# Patient Record
Sex: Male | Born: 1935 | Race: White | Hispanic: No | Marital: Single | Smoking: Never smoker
Health system: Southern US, Community
[De-identification: ages and names within clinical notes are randomized; demographics above are authoritative.]

## PROBLEM LIST (undated history)

## (undated) DIAGNOSIS — Z9289 Personal history of other medical treatment: Secondary | ICD-10-CM

## (undated) DIAGNOSIS — I639 Cerebral infarction, unspecified: Secondary | ICD-10-CM

## (undated) DIAGNOSIS — I10 Essential (primary) hypertension: Secondary | ICD-10-CM

## (undated) DIAGNOSIS — Z8669 Personal history of other diseases of the nervous system and sense organs: Secondary | ICD-10-CM

## (undated) HISTORY — PX: MASS EXCISION: SHX2000

## (undated) HISTORY — PX: CATARACT EXTRACTION, BILATERAL: SHX1313

---

## 2000-09-14 DIAGNOSIS — I639 Cerebral infarction, unspecified: Secondary | ICD-10-CM

## 2000-09-14 HISTORY — DX: Cerebral infarction, unspecified: I63.9

## 2001-10-15 HISTORY — PX: CAROTID ENDARTERECTOMY: SUR193

## 2001-11-02 ENCOUNTER — Ambulatory Visit (HOSPITAL_COMMUNITY): Admission: RE | Admit: 2001-11-02 | Discharge: 2001-11-02 | Payer: Self-pay | Admitting: Family Medicine

## 2001-11-03 ENCOUNTER — Ambulatory Visit (HOSPITAL_COMMUNITY): Admission: RE | Admit: 2001-11-03 | Discharge: 2001-11-03 | Payer: Self-pay | Admitting: Family Medicine

## 2001-11-03 ENCOUNTER — Encounter: Payer: Self-pay | Admitting: Family Medicine

## 2001-11-09 ENCOUNTER — Encounter: Payer: Self-pay | Admitting: Vascular Surgery

## 2001-11-11 ENCOUNTER — Encounter (INDEPENDENT_AMBULATORY_CARE_PROVIDER_SITE_OTHER): Payer: Self-pay | Admitting: Specialist

## 2001-11-11 ENCOUNTER — Inpatient Hospital Stay (HOSPITAL_COMMUNITY): Admission: RE | Admit: 2001-11-11 | Discharge: 2001-11-12 | Payer: Self-pay | Admitting: Vascular Surgery

## 2001-11-12 ENCOUNTER — Emergency Department (HOSPITAL_COMMUNITY): Admission: EM | Admit: 2001-11-12 | Discharge: 2001-11-12 | Payer: Self-pay | Admitting: Emergency Medicine

## 2007-02-08 ENCOUNTER — Ambulatory Visit: Payer: Self-pay | Admitting: Vascular Surgery

## 2011-01-30 NOTE — Discharge Summary (Signed)
[place]. [facility]  Patient:    Shane Weiss, Shane Weiss Visit Number: [id number] MRN: [id number]          Service Type: EMS Location: [facility] Attending Physician:  Donnetta Hutching Dictated by:   Sherrie George, P.A. Admit Date:  11/12/2001 Discharge Date: 11/12/2001   CC:         Shane Weiss, M.D.   Discharge Summary  DATE OF BIRTH:  01-06-1925  ADMISSION DIAGNOSES: 1. Severe right internal carotid artery stenosis. 2. Hypertension. 3. History of right-sided cerebrovascular accident. 4. History of Bells palsy.  ALLERGIES:  No known drug allergies.  MEDICATIONS:  Lotrelle 20 mg q.d., aspirin 325 mg q.d., Xazosin 4 mg p.o. q.d.  For further history and physical, please see the dictated note.  HOSPITAL COURSE:  The patient was admitted and found to have severe right ICA stenosis preoperatively.  Evaluation by Dr. Hart Rochester lead to his admission for elective right carotid endarterectomy.  The risks and benefits were discussed in detail and informed operative consent was obtained.  He underwent right CE by Dr. Hart Rochester on November 11, 2001.  He tolerated the procedure well and returned to PACU and then 3300 in satisfactory condition.  First postoperative morning, he was neurologically intact.  Wound looked good.  He was mobilized. Labs were all within normal limits with hemoglobin 13, hematocrit 40, white count 7.9, and platelets of 198,000.  Electrolytes were normal; BUN 10, creatinine 1.5.  He was subsequently discharged home in the afternoon after taking p.o. for breakfast and lunch.  DISCHARGE MEDICATIONS:  Preadmission medicine as before and Tylox one to two p.o. q.4h. p.r.n.  FOLLOW-UP:  He was scheduled to return to see Dr. Hart Rochester on Tuesday, March 11, at 9:40 a.m.  His creatinine was slightly elevated at 1.5 postoperatively, was up from his preadmission creatinine of 1.3.  CONDITION ON DISCHARGE:  Improved.  DISCHARGE INSTRUCTIONS:  Walk  daily.  No lifting over 10 pounds.  No driving. No strenuous activity.  Home medicines as before.  Tylox one to two p.o. q.4h. p.r.n. Dictated by:   Sherrie George, P.A. Attending Physician:  Donnetta Hutching DD:  11/21/01 TD:  11/23/01 Job: [id number]

## 2011-01-30 NOTE — Op Note (Signed)
[place]. [facility]  Patient:    Shane, Weiss Visit Number: [id number] MRN: [id number]          Service Type: EMS Location: [facility] Attending Physician:  Donnetta Hutching Dictated by:   Quita Skye. Hart Rochester, M.D. Proc. Date: 11/11/01 Admit Date:  11/12/2001 Discharge Date: 11/12/2001                             Operative Report  PREOPERATIVE DIAGNOSIS:  Severe right internal carotid stenosis with recent right brain cerebrovascular accident.  POSTOPERATIVE DIAGNOSIS:  Severe right internal carotid stenosis with recent right brain cerebrovascular accident.  OPERATION:  Right carotid endarterectomy with Dacron patch angioplasty.  SURGEON:  Quita Skye. Hart Rochester, M.D.  FIRST ASSISTANT:  Larina Earthly, M.D.  SECOND ASSISTANT:  Nurse.  ANESTHESIA:  General endotracheal.  BRIEF HISTORY:  This patient recently suffered a right brain CVA consisting of left upper extremity weakness and clumsiness and some left facial weakness and garbled speech.  This almost completely resolved within the first several days and he now has returned to about 90% of his normal function in the left upper extremity.  He is scheduled now for a right carotid endarterectomy.  He does have a remote history of a Bells palsy causing left facial weakness.  DESCRIPTION OF PROCEDURE:  Patient was taken to the operating room and placed in a supine position, at which time satisfactory general endotracheal anesthesia was administered.  The right neck was prepped with Betadine scrubbing solution and draped in routine sterile manner.  An incision was made along the anterior border of the sternocleidomastoid muscle and carried down through subcutaneous tissue and platysma using the Bovie.  The common facial vein and external jugular vein were ligated with 3-0 silk ties and divided, exposing the common, internal and external carotid arteries.  Care was taken not to injure the vagus or hypoglossal nerves,  both of which were exposed. There was a calcified atherosclerotic plaque at the carotid bifurcation extending up the internal carotid artery about 2 to 3 cm, with the distal vessel appearing normal.  A #10 shunt was prepared and the patient was heparinized.  The carotid vessels were occluded with vascular clamps, longitudinal opening made in the common carotid with a 15 blade and extended up the internal carotid with the Potts scissors to a point distal to the disease.  Plaque was severely stenotic, at least 95%, and very ulcerated.  A #10 shunt was inserted without difficulty, reestablishing flow in about 2 minutes.  A standard endarterectomy was then performed using the elevator and the Potts scissors with an eversion endarterectomy at the external carotid.  The plaque feathered off the distal internal carotid artery nicely, not requiring any tacking sutures.  The lumen was thoroughly irrigated with heparin and saline and all loose debris carefully removed and arteriotomy was closed with a patch using continuous 6-0 Prolene.  Prior to completion of closure, the shunt was removed after about 30 minutes of shunt time following antegrade and retrograde flushing.  The closure was completed with reestablishment of flow initially up the external, then up the internal branch.  The carotid was occluded for less than two minutes for removal of the shunt.  Protamine was then given to reverse the heparin.  Following adequate hemostasis, wound was irrigated with saline and closed in layers with Vicryl in a subcuticular fashion, sterile dressing applied and patient taken to recovery room in satisfactory condition. Dictated  by:   Quita Skye Hart Rochester, M.D. Attending Physician:  Donnetta Hutching DD:  11/11/01 TD:  11/11/01 Job: [id number]

## 2011-01-30 NOTE — H&P (Signed)
. [facility]  Patient:    Shane Weiss, Shane Weiss Visit Number: [id number] MRN: [id number]          Service Type: Attending:  Quita Skye. Hart Rochester, M.D. Dictated by:   Adair Patter, P.A. Adm. Date:  11/11/01   CC:         CVTS office   History and Physical  CHIEF COMPLAINT:  Carotid artery disease.  HISTORY OF PRESENT ILLNESS:  This is a 77 year old male who was referred from Dr. Holley Bouche.  The patient states approximately 2-3 weeks ago he had sudden onset of left arm and hand weakness, clumsiness, and facial weakness and slurred speech.  He said with the exception of some left arm clumsiness these symptoms resolved in the next few hours.  Today he comes to the office for a preoperative history and physical and states that all of his symptoms have resolved except for some mild weakness in his left arm.  The patient says he feels that his muscle strength is about 90% of what it was before this episode.  During this time the patient denies any headaches, nausea, vomiting, vertigo, dizziness, history of falls, seizures, numbness, or tingling.  He denied any dysphagia or visual changes.  He denied any syncope or presyncope, memory loss, or confusion with this episode.  PAST MEDICAL HISTORY: 1. Carotid artery disease. 2. Hypertension. 3. Right-sided cerebrovascular accident. 4. Bells palsy.  PAST SURGICAL HISTORY:  The patient denies any surgeries except for minor scalp surgery.  ALLERGIES:  The patient denies any medication allergies.  MEDICATIONS: 1. Lotrel 20 mg p.o. q.d. 2. Aspirin 325 mg p.o. q.d. 3. Doxazosin 4 mg p.o. q.d.  FAMILY HISTORY:  The patients mother died of stomach cancer.  His father had a history positive for cerebrovascular accident and myocardial infarction.  He has a sister who is still living and has diabetes mellitus and hypertension.  SOCIAL HISTORY:  The patient lives alone, denies any alcohol use.  He smoked about a  half pack of cigarettes a day for less than five years but quit in 1960.  REVIEW OF SYSTEMS:  GENERAL:  The patient denies any fever, chills, night sweats, or frequent illnesses.  HEAD:  The patient denies any head injuries, loss of consciousness, or seizures.  EYES:  The patient denies any visual disturbances, but does wear corrective lenses.  EARS:  The patient denies any tinnitus, vertigo, hearing loss, or ear infections.  NOSE:  The patient denies any epistaxis, rhinitis, or sinusitis.  MOUTH:  The patient denies any problems with dentition or frequent sore throats.  NECK:  The patient denies any lumps, masses, or pain on range of motion of his neck.  CARDIOVASCULAR: The patient has a history of hypertension which is treated medically.  He denies any history of myocardial infarction.  PULMONARY:  The patient denies any asthma, bronchitis, emphysema, or pneumonia.  ENDOCRINE:  The patient denies any thyroid disease or diabetes mellitus.  GASTROINTESTINAL:  The patient denies any nausea, vomiting, diarrhea, constipation, hematochezia, or melena.  GENITOURINARY:  The patient denies any impotence, urinary tract infections, or incontinence.  MUSCULOSKELETAL:  The patient denies any arthritis, arthralgias, or myalgias.  NEUROLOGIC:  The patient has a history of right-sided cerebrovascular accident as stated in the history of present illness.  He denies any memory loss or depression.  PHYSICAL EXAMINATION:  VITAL SIGNS:  Blood pressure is 138/80, pulse is 84 and regular, respirations are 16.  GENERAL:  The patient is alert and oriented  x3 and is in no distress.  HEENT:  Head is atraumatic, normocephalic.  Eyes:  Pupils are equal, round, reactive to light and accommodation.  Extraocular movements are intact, without scleral icterus or nystagmus.  Ears:  Auditory acuity is grossly intact.  Nose:  Nares are patent and intact.  Sinuses are nontender.  Mouth: Moist without exudate.  NECK:   Supple without JVD, lymphadenopathy, or thyromegaly.  He had a right-sided carotid bruit.  CARDIOVASCULAR:  Revealed regular rate and rhythm without murmurs, gallops, or rubs.  LUNGS:  Bilaterally clear to auscultation.  No rales, rhonchi, or wheezes.  ABDOMEN:  Soft, nontender, nondistended.  Positive bowel sounds in all four quadrants.  EXTREMITIES:  Revealed no cyanosis, clubbing, or edema.  Peripheral pulse exam revealed 2+ carotid, femoral, popliteal pulses and 1+ dorsalis pedis pulses bilaterally.  NEUROLOGIC:  Cranial nerves II-XII are grossly intact.  The patient had a steady gait without the use of any assistive devices.  He had 5+ and equal strength in all four extremities.  No muscular deficit was noted on the left side of his body.  IMPRESSION:  Right internal carotid artery stenosis.  PLAN:  The patient will undergo a right carotid endarterectomy on February 28 by Dr. Josephina Gip. Dictated by:   Adair Patter, P.A. Attending:  Quita Skye. Hart Rochester, M.D. DD:  11/09/01 TD:  11/09/01 Job: [id number]

## 2017-09-08 ENCOUNTER — Emergency Department (HOSPITAL_COMMUNITY): Payer: Medicare Other

## 2017-09-08 ENCOUNTER — Encounter (HOSPITAL_COMMUNITY): Payer: Self-pay

## 2017-09-08 ENCOUNTER — Emergency Department (HOSPITAL_COMMUNITY)
Admission: EM | Admit: 2017-09-08 | Discharge: 2017-09-09 | Disposition: A | Discharge status: Home / Self Care | Payer: Medicare Other | Attending: Emergency Medicine | Admitting: Emergency Medicine

## 2017-09-08 DIAGNOSIS — R197 Diarrhea, unspecified: Secondary | ICD-10-CM | POA: Diagnosis present

## 2017-09-08 DIAGNOSIS — Z7982 Long term (current) use of aspirin: Secondary | ICD-10-CM | POA: Diagnosis not present

## 2017-09-08 DIAGNOSIS — Z8673 Personal history of transient ischemic attack (TIA), and cerebral infarction without residual deficits: Secondary | ICD-10-CM | POA: Diagnosis not present

## 2017-09-08 DIAGNOSIS — K529 Noninfective gastroenteritis and colitis, unspecified: Secondary | ICD-10-CM

## 2017-09-08 DIAGNOSIS — Z79899 Other long term (current) drug therapy: Secondary | ICD-10-CM | POA: Diagnosis not present

## 2017-09-08 DIAGNOSIS — I10 Essential (primary) hypertension: Secondary | ICD-10-CM | POA: Insufficient documentation

## 2017-09-08 HISTORY — DX: Essential (primary) hypertension: I10

## 2017-09-08 HISTORY — DX: Cerebral infarction, unspecified: I63.9

## 2017-09-08 LAB — CBG MONITORING, ED: Glucose-Capillary: 132 mg/dL — ABNORMAL HIGH (ref 65–99)

## 2017-09-08 LAB — COMPREHENSIVE METABOLIC PANEL
ALK PHOS: 82 U/L (ref 38–126)
ALT: 11 U/L — AB (ref 17–63)
AST: 25 U/L (ref 15–41)
Albumin: 3.1 g/dL — ABNORMAL LOW (ref 3.5–5.0)
Anion gap: 9 (ref 5–15)
BUN: 25 mg/dL — ABNORMAL HIGH (ref 6–20)
CALCIUM: 8.3 mg/dL — AB (ref 8.9–10.3)
CO2: 20 mmol/L — ABNORMAL LOW (ref 22–32)
CREATININE: 1.87 mg/dL — AB (ref 0.61–1.24)
Chloride: 110 mmol/L (ref 101–111)
GFR, EST AFRICAN AMERICAN: 35 mL/min — AB (ref >60)
GFR, EST NON AFRICAN AMERICAN: 30 mL/min — AB (ref >60)
Glucose, Bld: 140 mg/dL — ABNORMAL HIGH (ref 65–99)
Potassium: 5.1 mmol/L (ref 3.5–5.1)
Sodium: 139 mmol/L (ref 135–145)
Total Bilirubin: 1.6 mg/dL — ABNORMAL HIGH (ref 0.3–1.2)
Total Protein: 6.2 g/dL — ABNORMAL LOW (ref 6.5–8.1)

## 2017-09-08 LAB — CBC
HCT: 38.8 % — ABNORMAL LOW (ref 39.0–52.0)
Hemoglobin: 12.5 g/dL — ABNORMAL LOW (ref 13.0–17.0)
MCH: 30.9 pg (ref 26.0–34.0)
MCHC: 32.2 g/dL (ref 30.0–36.0)
MCV: 95.8 fL (ref 78.0–100.0)
PLATELETS: 145 10*3/uL — AB (ref 150–400)
RBC: 4.05 MIL/uL — AB (ref 4.22–5.81)
RDW: 14.3 % (ref 11.5–15.5)
WBC: 14.4 10*3/uL — AB (ref 4.0–10.5)

## 2017-09-08 LAB — LIPASE, BLOOD: Lipase: 28 U/L (ref 11–51)

## 2017-09-08 LAB — URINALYSIS, ROUTINE W REFLEX MICROSCOPIC
BILIRUBIN URINE: NEGATIVE
Glucose, UA: NEGATIVE mg/dL
Ketones, ur: NEGATIVE mg/dL
LEUKOCYTES UA: NEGATIVE
Nitrite: NEGATIVE
PH: 5 (ref 5.0–8.0)
Protein, ur: NEGATIVE mg/dL
SPECIFIC GRAVITY, URINE: 1.01 (ref 1.005–1.030)

## 2017-09-08 LAB — C DIFFICILE QUICK SCREEN W PCR REFLEX
C DIFFICILE (CDIFF) INTERP: NOT DETECTED
C DIFFICILE (CDIFF) TOXIN: NEGATIVE
C DIFFICLE (CDIFF) ANTIGEN: NEGATIVE

## 2017-09-08 LAB — I-STAT CG4 LACTIC ACID, ED: Lactic Acid, Venous: 1.1 mmol/L (ref 0.5–1.9)

## 2017-09-08 LAB — POC OCCULT BLOOD, ED: FECAL OCCULT BLD: POSITIVE — AB

## 2017-09-08 LAB — I-STAT TROPONIN, ED: TROPONIN I, POC: 0.01 ng/mL (ref 0.00–0.08)

## 2017-09-08 MED ORDER — IOPAMIDOL (ISOVUE-300) INJECTION 61%
INTRAVENOUS | Status: AC
  Filled 2017-09-08: qty 100

## 2017-09-08 NOTE — ED Triage Notes (Signed)
Pt from home with ems. Family found pt slumped over in bathroom today and called EMS. Pt having difficult ambulating prior to episode which is not his baseline. Pt c.o severe abd pain that started today along with vomiting. Pt vomiting multiple times today. Pt had 2 BMs en route. Pt alert and oriented upon arrival, VSS, nad 18G LFA.

## 2017-09-08 NOTE — ED Provider Notes (Signed)
[facility] EMERGENCY [facility] Provider Note   CSN: [id number] Arrival date & time: 09/08/17  2019     History   Chief Complaint Chief Complaint  Patient presents with  . Loss of Consciousness  . Abdominal Pain  . Emesis    HPI Shane Weiss is a 83 y.o. male.  HPI   Patient is a 83 year old male with past medical history significant for "mini strokes".  And hypertension.  Patient will is here today with acute onset of vomiting and diarrhea.  Patient was washing well for correction at 7 PM when he had acute onset of abdominal pain.  He felt like he had a stool, could not.  Then vomited once and then stooled multiple times.  Patient had a syncopal episode while on the toilet.  Patient lost control of his bowels several times.  Reports the stool is not particularly dark or black.  He ate the same thing his wife, chicken fried steak, biscuits, green beans, cantelope  Since coming to the emergency department, patient has had 3 large stooling events.  No recent antibiotic use.  Despite having stooled multiple times before me seeing him.  On arrival patient is asking immediately to go home.  Patient does have normal vital signs.  Past Medical History:  Diagnosis Date  . Hypertension   . Stroke [facility](HCC)    2002    There are no active problems to display for this patient.       Home Medications    Prior to Admission medications   Medication Sig Start Date End Date Taking? Authorizing Provider  amLODipine (NORVASC) 10 MG tablet Take 10 mg by mouth daily. 08/18/17  Yes [provider]  aspirin 81 MG tablet Take 81 mg by mouth daily.   Yes [provider]  benazepril (LOTENSIN) 40 MG tablet Take 40 mg by mouth daily. 07/08/13  Yes [provider]  dorzolamide-timolol (COSOPT) 22.3-6.8 MG/ML ophthalmic solution Place 1 drop into both eyes at bedtime. 08/30/17  Yes [provider]  doxazosin (CARDURA) 8 MG tablet Take 8 mg  by mouth at bedtime.  08/19/17  Yes [provider]    Family History No family history on file.  Social History Social History   Tobacco Use  . Smoking status: Not on file  Substance Use Topics  . Alcohol use: Not on file  . Drug use: Not on file     Allergies   Patient has no allergy information on record.   Review of Systems Review of Systems  Constitutional: Positive for fatigue. Negative for activity change and fever.  Respiratory: Negative for shortness of breath.   Cardiovascular: Negative for chest pain.  Gastrointestinal: Positive for abdominal pain, nausea and vomiting.  Neurological: Positive for syncope.  All other systems reviewed and are negative.    Physical Exam Updated Vital Signs BP (!) 141/53   Pulse 75   Temp 98.1 F (36.7 C) (Oral)   Resp (!) 30   Ht 5\' 11"  (1.803 m)   Wt 59 kg (130 lb)   SpO2 93%   BMI 18.13 kg/m   Physical Exam  Constitutional: He appears well-developed and well-nourished.  HENT:  Head: Normocephalic and atraumatic.  Eyes: Conjunctivae are normal.  Neck: Neck supple.  Cardiovascular: Normal rate and regular rhythm.  No murmur heard. Pulmonary/Chest: Effort normal and breath sounds normal. No respiratory distress.  Abdominal: Soft. Normal appearance and bowel sounds are normal. There is no tenderness.  Musculoskeletal: He exhibits  no edema.  Neurological: He is alert.  Skin: Skin is warm and dry.  Psychiatric: He has a normal mood and affect.  Nursing note and vitals reviewed.    ED Treatments / Results  Labs (all labs ordered are listed, but only abnormal results are displayed) Labs Reviewed  CBC - Abnormal; Notable for the following components:      Result Value   WBC 14.4 (*)    RBC 4.05 (*)    Hemoglobin 12.5 (*)    HCT 38.8 (*)    Platelets 145 (*)    All other components within normal limits  CBG MONITORING, ED - Abnormal; Notable for the following components:   Glucose-Capillary 132 (*)      All other components within normal limits  POC OCCULT BLOOD, ED - Abnormal; Notable for the following components:   Fecal Occult Bld POSITIVE (*)    All other components within normal limits  GASTROINTESTINAL PANEL BY PCR, STOOL (REPLACES STOOL CULTURE)  C DIFFICILE QUICK SCREEN W PCR REFLEX  LIPASE, BLOOD  COMPREHENSIVE METABOLIC PANEL  URINALYSIS, ROUTINE W REFLEX MICROSCOPIC  OCCULT BLOOD X 1 CARD TO LAB, STOOL    EKG  EKG Interpretation None       Radiology No results found.  Procedures Procedures (including critical care time)  Medications Ordered in ED Medications - No data to display   Initial Impression / Assessment and Plan / ED Course  I have reviewed the triage vital signs and the nursing notes.  Pertinent labs & imaging results that were available during my care of the patient were reviewed by me and considered in my medical decision making (see chart for details).     Patient is a 83 year old male with past medical history significant for "mini strokes".  And hypertension.  Patient will is here today with acute onset of vomiting and diarrhea.  Patient was washing well for correction at 7 PM when he had acute onset of abdominal pain.  He felt like he had a stool, could not.  Then vomited once and then stooled multiple times.  Patient had a syncopal episode while on the toilet.  Patient lost control of his bowels several times.  Reports the stool is not particularly dark or black.  He ate the same thing his wife, chicken fried steak, biscuits, green beans, cantelope  Since coming to the emergency department, patient has had 3 large stooling events.  No recent antibiotic use.  Despite having stooled multiple times before me seeing him.  On arrival patient is asking immediately to go home.  Patient does have normal vital signs.  10:17 PM Given his acute symtpoms, concern for obstruction.   Could also cause consider GI bleed, however the still not appear  black or tarry.  It was a fecal occult positive.  Patient not have the pain now, so less likely to be mesenteric ischemia.  Also possible that its just a reaction to a preformed toxin.  Signed out to colleague to follow up CT.   Final Clinical Impressions(s) / ED Diagnoses   Final diagnoses:  None    ED Discharge Orders    None       Abelino DerrickMackuen, Aldo Sondgeroth Lyn, MD 09/09/17 [id number]

## 2017-09-08 NOTE — ED Notes (Signed)
Patient transported to CT 

## 2017-09-08 NOTE — ED Notes (Signed)
Attempted to obtain UA, patient aware of need for urine specimen.

## 2017-09-09 LAB — GASTROINTESTINAL PANEL BY PCR, STOOL (REPLACES STOOL CULTURE)

## 2017-09-09 MED ORDER — METRONIDAZOLE 500 MG PO TABS: 500.0000 mg | ORAL_TABLET | Freq: Two times a day (BID) | ORAL | 0 refills | Status: DC

## 2017-09-09 MED ORDER — CIPROFLOXACIN HCL 500 MG PO TABS: 500.0000 mg | ORAL_TABLET | Freq: Two times a day (BID) | ORAL | 0 refills | Status: DC

## 2017-09-09 MED ORDER — METRONIDAZOLE 500 MG PO TABS
500.0000 mg | ORAL_TABLET | Freq: Once | ORAL | Status: AC
  Administered 2017-09-09: 500 mg via ORAL
  Filled 2017-09-09: qty 1

## 2017-09-09 MED ORDER — CIPROFLOXACIN HCL 500 MG PO TABS
500.0000 mg | ORAL_TABLET | Freq: Once | ORAL | Status: AC
  Administered 2017-09-09: 500 mg via ORAL
  Filled 2017-09-09: qty 1

## 2017-09-09 NOTE — ED Notes (Signed)
Pt given water and crackers, tolerated well.

## 2017-09-09 NOTE — ED Provider Notes (Signed)
I received this patient in signout from Dr. Corlis LeakMacKuen; we were awaiting CT results. CT shows wall thickening of sigmoid colon suggestive of infectious process with possible underlying malignancy. I discussed these findings with the patient and family, including need for GI referral for colonoscopy. Elected to initiate  Therapy with cipro/flagyl. Ultimately recommended overnight admission given severity of symptoms, findings on CT, and syncope while on toilet. Pt wanted to go home and voiced understanding of risks. They have also voiced understanding of return precautions and treatment/follow up plan.   Shane Weiss, [name], MD 09/09/17 [phone]

## 2017-09-09 NOTE — Discharge Instructions (Signed)
DRINK PLENTY OF FLUIDS. TAKE ALL ANTIBIOTICS UNTIL FINISHED. FOLLOW UP WITH [facility] FOR A COLONOSCOPY. RETURN TO ER IF ANY WORSENING SYMPTOMS, BLOODY STOOLS, BLACK STOOLS, FEVER, SEVERE ABDOMINAL PAIN, OR CONCERNS FOR DEHYDRATION.

## 2018-03-30 ENCOUNTER — Emergency Department (HOSPITAL_COMMUNITY): Payer: Medicare Other

## 2018-03-30 ENCOUNTER — Other Ambulatory Visit: Payer: Self-pay

## 2018-03-30 ENCOUNTER — Encounter (HOSPITAL_COMMUNITY): Payer: Self-pay | Admitting: Internal Medicine

## 2018-03-30 ENCOUNTER — Inpatient Hospital Stay (HOSPITAL_COMMUNITY)
Admission: EM | Admit: 2018-03-30 | Discharge: 2018-04-02 | DRG: 372 | Disposition: A | Discharge status: Home Health | Payer: Medicare Other | Attending: Internal Medicine | Admitting: Internal Medicine

## 2018-03-30 DIAGNOSIS — Z8249 Family history of ischemic heart disease and other diseases of the circulatory system: Secondary | ICD-10-CM

## 2018-03-30 DIAGNOSIS — Z8673 Personal history of transient ischemic attack (TIA), and cerebral infarction without residual deficits: Secondary | ICD-10-CM

## 2018-03-30 DIAGNOSIS — Z7982 Long term (current) use of aspirin: Secondary | ICD-10-CM

## 2018-03-30 DIAGNOSIS — Z9842 Cataract extraction status, left eye: Secondary | ICD-10-CM

## 2018-03-30 DIAGNOSIS — K449 Diaphragmatic hernia without obstruction or gangrene: Secondary | ICD-10-CM | POA: Diagnosis present

## 2018-03-30 DIAGNOSIS — E860 Dehydration: Secondary | ICD-10-CM | POA: Diagnosis present

## 2018-03-30 DIAGNOSIS — D509 Iron deficiency anemia, unspecified: Secondary | ICD-10-CM | POA: Diagnosis present

## 2018-03-30 DIAGNOSIS — I10 Essential (primary) hypertension: Secondary | ICD-10-CM | POA: Diagnosis not present

## 2018-03-30 DIAGNOSIS — N184 Chronic kidney disease, stage 4 (severe): Secondary | ICD-10-CM | POA: Diagnosis present

## 2018-03-30 DIAGNOSIS — I129 Hypertensive chronic kidney disease with stage 1 through stage 4 chronic kidney disease, or unspecified chronic kidney disease: Secondary | ICD-10-CM | POA: Diagnosis present

## 2018-03-30 DIAGNOSIS — R569 Unspecified convulsions: Secondary | ICD-10-CM | POA: Diagnosis present

## 2018-03-30 DIAGNOSIS — I959 Hypotension, unspecified: Secondary | ICD-10-CM | POA: Diagnosis present

## 2018-03-30 DIAGNOSIS — D649 Anemia, unspecified: Secondary | ICD-10-CM

## 2018-03-30 DIAGNOSIS — Z9841 Cataract extraction status, right eye: Secondary | ICD-10-CM | POA: Diagnosis not present

## 2018-03-30 DIAGNOSIS — R55 Syncope and collapse: Secondary | ICD-10-CM

## 2018-03-30 DIAGNOSIS — D500 Iron deficiency anemia secondary to blood loss (chronic): Secondary | ICD-10-CM | POA: Diagnosis not present

## 2018-03-30 DIAGNOSIS — K317 Polyp of stomach and duodenum: Secondary | ICD-10-CM | POA: Diagnosis present

## 2018-03-30 DIAGNOSIS — Z9289 Personal history of other medical treatment: Secondary | ICD-10-CM

## 2018-03-30 DIAGNOSIS — A0472 Enterocolitis due to Clostridium difficile, not specified as recurrent: Principal | ICD-10-CM | POA: Diagnosis present

## 2018-03-30 DIAGNOSIS — R262 Difficulty in walking, not elsewhere classified: Secondary | ICD-10-CM

## 2018-03-30 DIAGNOSIS — K210 Gastro-esophageal reflux disease with esophagitis: Secondary | ICD-10-CM | POA: Diagnosis present

## 2018-03-30 DIAGNOSIS — Z79899 Other long term (current) drug therapy: Secondary | ICD-10-CM | POA: Diagnosis not present

## 2018-03-30 DIAGNOSIS — N179 Acute kidney failure, unspecified: Secondary | ICD-10-CM | POA: Diagnosis present

## 2018-03-30 DIAGNOSIS — D62 Acute posthemorrhagic anemia: Secondary | ICD-10-CM | POA: Diagnosis present

## 2018-03-30 DIAGNOSIS — R296 Repeated falls: Secondary | ICD-10-CM | POA: Diagnosis present

## 2018-03-30 DIAGNOSIS — K2940 Chronic atrophic gastritis without bleeding: Secondary | ICD-10-CM | POA: Diagnosis present

## 2018-03-30 DIAGNOSIS — R269 Unspecified abnormalities of gait and mobility: Secondary | ICD-10-CM | POA: Diagnosis present

## 2018-03-30 HISTORY — DX: Personal history of other medical treatment: Z92.89

## 2018-03-30 HISTORY — DX: Personal history of other diseases of the nervous system and sense organs: Z86.69

## 2018-03-30 LAB — HEMOGLOBIN AND HEMATOCRIT, BLOOD
HCT: 23.5 % — ABNORMAL LOW (ref 39.0–52.0)
Hemoglobin: 6 g/dL — CL (ref 13.0–17.0)

## 2018-03-30 LAB — ABO/RH: ABO/RH(D): A POS

## 2018-03-30 LAB — BASIC METABOLIC PANEL
Anion gap: 11 (ref 5–15)
BUN: 22 mg/dL (ref 8–23)
CALCIUM: 8 mg/dL — AB (ref 8.9–10.3)
CHLORIDE: 113 mmol/L — AB (ref 98–111)
CO2: 18 mmol/L — AB (ref 22–32)
CREATININE: 1.84 mg/dL — AB (ref 0.61–1.24)
GFR calc Af Amer: 36 mL/min — ABNORMAL LOW (ref >60)
GFR calc non Af Amer: 31 mL/min — ABNORMAL LOW (ref >60)
Glucose, Bld: 154 mg/dL — ABNORMAL HIGH (ref 70–99)
Potassium: 4.7 mmol/L (ref 3.5–5.1)
Sodium: 142 mmol/L (ref 135–145)

## 2018-03-30 LAB — CBC WITH DIFFERENTIAL/PLATELET
Abs Immature Granulocytes: 0.1 10*3/uL (ref 0.0–0.1)
Basophils Absolute: 0 10*3/uL (ref 0.0–0.1)
Basophils Relative: 0 %
EOS ABS: 0 10*3/uL (ref 0.0–0.7)
Eosinophils Relative: 0 %
HEMATOCRIT: 21.9 % — AB (ref 39.0–52.0)
Hemoglobin: 5.6 g/dL — CL (ref 13.0–17.0)
Immature Granulocytes: 1 %
LYMPHS ABS: 0.6 10*3/uL — AB (ref 0.7–4.0)
Lymphocytes Relative: 4 %
MCH: 20.5 pg — AB (ref 26.0–34.0)
MCHC: 25.6 g/dL — AB (ref 30.0–36.0)
MCV: 80.2 fL (ref 78.0–100.0)
MONO ABS: 0.6 10*3/uL (ref 0.1–1.0)
MONOS PCT: 4 %
Neutro Abs: 13.2 10*3/uL — ABNORMAL HIGH (ref 1.7–7.7)
Neutrophils Relative %: 91 %
Platelets: 281 10*3/uL (ref 150–400)
RBC: 2.73 MIL/uL — ABNORMAL LOW (ref 4.22–5.81)
RDW: 19.3 % — AB (ref 11.5–15.5)
WBC: 14.5 10*3/uL — ABNORMAL HIGH (ref 4.0–10.5)

## 2018-03-30 LAB — CBG MONITORING, ED: GLUCOSE-CAPILLARY: 128 mg/dL — AB (ref 70–99)

## 2018-03-30 LAB — IRON AND TIBC
IRON: 16 ug/dL — AB (ref 45–182)
SATURATION RATIOS: 7 % — AB (ref 17.9–39.5)
TIBC: 231 ug/dL — ABNORMAL LOW (ref 250–450)
UIBC: 215 ug/dL

## 2018-03-30 LAB — VITAMIN B12: Vitamin B-12: 222 pg/mL (ref 180–914)

## 2018-03-30 LAB — PREPARE RBC (CROSSMATCH)

## 2018-03-30 LAB — LACTATE DEHYDROGENASE: LDH: 184 U/L (ref 98–192)

## 2018-03-30 LAB — SAVE SMEAR

## 2018-03-30 LAB — RETICULOCYTES
RBC.: 2.91 MIL/uL — AB (ref 4.22–5.81)
RETIC COUNT ABSOLUTE: 43.7 10*3/uL (ref 19.0–186.0)
Retic Ct Pct: 1.5 % (ref 0.4–3.1)

## 2018-03-30 LAB — FERRITIN: FERRITIN: 8 ng/mL — AB (ref 24–336)

## 2018-03-30 LAB — I-STAT TROPONIN, ED: Troponin i, poc: 0 ng/mL (ref 0.00–0.08)

## 2018-03-30 LAB — FOLATE: FOLATE: 14.8 ng/mL (ref >5.9)

## 2018-03-30 LAB — POC OCCULT BLOOD, ED: Fecal Occult Bld: POSITIVE — AB

## 2018-03-30 MED ORDER — LATANOPROST 0.005 % OP SOLN
1.0000 [drp] | Freq: Every day | OPHTHALMIC | Status: DC
  Administered 2018-03-30 – 2018-04-01 (×3): 1 [drp] via OPHTHALMIC
  Filled 2018-03-30: qty 2.5

## 2018-03-30 MED ORDER — ACETAMINOPHEN 650 MG RE SUPP: 650.0000 mg | Freq: Four times a day (QID) | RECTAL | Status: DC | PRN

## 2018-03-30 MED ORDER — ONDANSETRON HCL 4 MG PO TABS
4.0000 mg | ORAL_TABLET | Freq: Four times a day (QID) | ORAL | Status: DC | PRN
  Administered 2018-03-30: 4 mg via ORAL
  Filled 2018-03-30: qty 1

## 2018-03-30 MED ORDER — SODIUM CHLORIDE 0.9 % IV SOLN
10.0000 mL/h | Freq: Once | INTRAVENOUS | Status: AC
  Administered 2018-03-30: 10 mL/h via INTRAVENOUS

## 2018-03-30 MED ORDER — SODIUM CHLORIDE 0.9 % IV BOLUS
1000.0000 mL | Freq: Once | INTRAVENOUS | Status: AC
  Administered 2018-03-30: 1000 mL via INTRAVENOUS

## 2018-03-30 MED ORDER — ONDANSETRON HCL 4 MG/2ML IJ SOLN: 4.0000 mg | Freq: Four times a day (QID) | INTRAMUSCULAR | Status: DC | PRN

## 2018-03-30 MED ORDER — ENSURE ENLIVE PO LIQD
237.0000 mL | Freq: Two times a day (BID) | ORAL | Status: DC
  Administered 2018-03-31 – 2018-04-02 (×4): 237 mL via ORAL

## 2018-03-30 MED ORDER — SODIUM CHLORIDE 0.9 % IV SOLN
INTRAVENOUS | Status: DC
  Administered 2018-03-30: 20:00:00 via INTRAVENOUS
  Administered 2018-03-31: 1000 mL via INTRAVENOUS
  Administered 2018-04-01 (×2): via INTRAVENOUS

## 2018-03-30 MED ORDER — GI COCKTAIL ~~LOC~~
30.0000 mL | Freq: Once | ORAL | Status: AC
  Administered 2018-03-30: 30 mL via ORAL
  Filled 2018-03-30: qty 30

## 2018-03-30 MED ORDER — DORZOLAMIDE HCL-TIMOLOL MAL 2-0.5 % OP SOLN
1.0000 [drp] | Freq: Every day | OPHTHALMIC | Status: DC
  Administered 2018-03-30 – 2018-04-01 (×3): 1 [drp] via OPHTHALMIC
  Filled 2018-03-30: qty 10

## 2018-03-30 MED ORDER — ACETAMINOPHEN 325 MG PO TABS: 650.0000 mg | ORAL_TABLET | Freq: Four times a day (QID) | ORAL | Status: DC | PRN

## 2018-03-30 NOTE — ED Notes (Signed)
Patient tolerating blood well. No signs of reaction to blood. Pt has no complaints. Will continue to monitor.

## 2018-03-30 NOTE — H&P (Addendum)
History and Physical    Shane Weiss ZOX:[id number] DOB: 05/05/1925 DOA: 03/30/2018  I have briefly reviewed the patient's prior medical [facility]  PCP: [name], William, MD  Patient coming from: home with wife  Chief Complaint: syncopal event  HPI: Shane CoopLawrence Strite is a 84 y.o. male with medical history significant of CVA/HTN.  He lives at home with his wife and was in his usual health until May per the wife when he began to fall, have decreased energy, and weakness.  Today he had 2 reported syncopal episodes and reported seizure like activity.  Both syncopal episodes were with position changes.  He was found to be hypotensive and was given 500 cc by EMS.  Family reports he has been c/o visual troubles.   In the ER, he was found to be incontinent of stools and covered in feces.   BP was 95/37 and IVF was given with some improvement.  On labs, Hgb was found to be 5.6.  Labs ordered and plan is for 2 units PRBC.  Heme test pending.   Review of Systems: As per HPI otherwise 10 point review of systems negative.   Past Medical History:  Diagnosis Date  . Hypertension   . Stroke [facility](HCC)    2002    History reviewed. No pertinent surgical history.  Lives at home with wife- no tobacco, alcohol, drugs  No Known Allergies  Family hx: HTN, CAD  Prior to Admission medications   Medication Sig Start Date End Date Taking? Authorizing Provider  amLODipine (NORVASC) 10 MG tablet Take 10 mg by mouth daily. 08/18/17  Yes [provider]  aspirin 81 MG tablet Take 81 mg by mouth daily.   Yes [provider]  benazepril (LOTENSIN) 40 MG tablet Take 40 mg by mouth daily. 07/08/13  Yes [provider]  dorzolamide-timolol (COSOPT) 22.3-6.8 MG/ML ophthalmic solution Place 1 drop into both eyes at bedtime. 08/30/17  Yes [provider]  doxazosin (CARDURA) 8 MG tablet Take 8 mg by mouth at bedtime.  08/19/17  Yes [provider]  latanoprost  (XALATAN) 0.005 % ophthalmic solution Place 1 drop into both eyes at bedtime. 03/03/18  Yes [provider]    Physical Exam: Vitals:   03/30/18 1520 03/30/18 1530 03/30/18 1600 03/30/18 1620  BP: (!) 106/58 (!) 112/41 (!) 91/54 (!) 109/43  Pulse: 81 78 84 85  Resp: 18 20 (!) 25 (!) 21  Temp:    97.9 F (36.6 C)  TempSrc:    Oral  SpO2: 97% 96% 97% 98%      Constitutional: In bed, chronically ill appearing- covered in stool Eyes: pale conjunctivae  ENMT: Mucous membranes are dry. Posterior pharynx clear of any exudate or lesions. Poor dentation Neck: normal, supple, no masses, no thyromegaly Respiratory: clear to auscultation bilaterally, no wheezing, no crackles. Normal respiratory effort. No accessory muscle use.  Cardiovascular: Regular rate and rhythm, no murmurs / rubs / gallops. No extremity edema. 2+ pedal pulses.  Abdomen: mass up to the umbilicus-- ? bladder  Musculoskeletal: no clubbing / cyanosis. weak Psychiatric: pleasant and cooperative  Labs on Admission: I have personally reviewed following labs and imaging studies  CBC: Recent Labs  Lab 03/30/18 1434  WBC 14.5*  NEUTROABS 13.2*  HGB 5.6*  HCT 21.9*  MCV 80.2  PLT 281   Basic Metabolic Panel: Recent Labs  Lab 03/30/18 1434  NA 142  K 4.7  CL 113*  CO2 18*  GLUCOSE 154*  BUN 22  CREATININE 1.84*  CALCIUM 8.0*   GFR: CrCl cannot be calculated (Unknown ideal weight.). Liver Function Tests: No results for input(s): AST, ALT, ALKPHOS, BILITOT, PROT, ALBUMIN in the last 168 hours. No results for input(s): LIPASE, AMYLASE in the last 168 hours. No results for input(s): AMMONIA in the last 168 hours. Coagulation Profile: No results for input(s): INR, PROTIME in the last 168 hours. Cardiac Enzymes: No results for input(s): CKTOTAL, CKMB, CKMBINDEX, TROPONINI in the last 168 hours. BNP (last 3 results) No results for input(s): PROBNP in the last 8760 hours. HbA1C: No results for  input(s): HGBA1C in the last 72 hours. CBG: Recent Labs  Lab 03/30/18 1632  GLUCAP 128*   Lipid Profile: No results for input(s): CHOL, HDL, LDLCALC, TRIG, CHOLHDL, LDLDIRECT in the last 72 hours. Thyroid Function Tests: No results for input(s): TSH, T4TOTAL, FREET4, T3FREE, THYROIDAB in the last 72 hours. Anemia Panel: No results for input(s): VITAMINB12, FOLATE, FERRITIN, TIBC, IRON, RETICCTPCT in the last 72 hours. Urine analysis:    Component Value Date/Time   COLORURINE YELLOW 09/08/2017 2120   APPEARANCEUR HAZY (A) 09/08/2017 2120   LABSPEC 1.010 09/08/2017 2120   PHURINE 5.0 09/08/2017 2120   GLUCOSEU NEGATIVE 09/08/2017 2120   HGBUR MODERATE (A) 09/08/2017 2120   BILIRUBINUR NEGATIVE 09/08/2017 2120   KETONESUR NEGATIVE 09/08/2017 2120   PROTEINUR NEGATIVE 09/08/2017 2120   NITRITE NEGATIVE 09/08/2017 2120   LEUKOCYTESUR NEGATIVE 09/08/2017 2120     Radiological Exams on Admission: Ct Head Wo Contrast  Result Date: 03/30/2018 CLINICAL DATA:  Syncope, seizure-like activity EXAM: CT HEAD WITHOUT CONTRAST TECHNIQUE: Contiguous axial images were obtained from the base of the skull through the vertex without intravenous contrast. COMPARISON:  None. FINDINGS: Brain: No evidence of acute infarction, hemorrhage, hydrocephalus, extra-axial collection or mass lesion/mass effect. Encephalomalacic changes related to prior right frontal infarct. Subcortical white matter and periventricular small vessel ischemic changes. Vascular: Mild intracranial atherosclerosis. Skull: Normal. Negative for fracture or focal lesion. Sinuses/Orbits: Mild partial opacification of the bilateral ethmoid sinuses. Mastoid air cells are clear. Other: None. IMPRESSION: No evidence of acute intracranial abnormality. Small vessel ischemic changes. Encephalomalacic changes related to prior right frontal infarct. Electronically Signed   By: Charline Bills M.D.   On: 03/30/2018 15:38       Assessment/Plan Active Problems:   Anemia   Syncopal epidose -due to anemia and hypotension -PT Eval when medically stable  Anemia of unknown origin -has CKD so could be from that -heme test pending -Fe panel, LDH, haptoglobin, LFTs, smear pending  CKD IV -at baseline around 1.8  HTN -BP meds on hold due to low BP  H/o CVA -chronic changes on CT scan  ?urinary retention -bladder scan   DVT prophylaxis: scd  Code Status: full  Family Communication: at bedside     Admission status: sdu  Patient's BP and rectal temperature were low-- will change bed request to SDU and admission instead of observation    Joseph Art [facility]   If 7PM-7AM, please contact night-coverage [email or web address] Password [facility]  03/30/2018, 4:41 PM

## 2018-03-30 NOTE — ED Notes (Signed)
Hospitalist at the bedside 

## 2018-03-30 NOTE — ED Notes (Signed)
Pt does not have pacemaker 

## 2018-03-30 NOTE — ED Notes (Signed)
PA-C Fayrene HelperBowie Tran and Dr. Adela LankFloyd made aware of patient's critical hemoglobin.

## 2018-03-30 NOTE — ED Notes (Addendum)
Pt's rectal temperature 97.0. Dr. Benjamine MolaVann aware and advised this RN to place pt on bear hugger. Admit order changed to step down per Dr. Benjamine MolaVann.

## 2018-03-30 NOTE — ED Notes (Addendum)
Warming blanket (bear hugger) applied to patient. Lights dimmed for comfort and to promote rest.

## 2018-03-30 NOTE — ED Triage Notes (Addendum)
Pt here from home after syncopal episode this afternoon. Per EMS, pt lost consciousness in his living room and potentially had seizure like activity per son and wife. Pt experienced another syncopal episode with EMS upon standing. No seizure activity noted by EMS. A/O x3. Syncopal episode lasted 30 seconds for EMS. Given 500 NS bolus, 4mg  zofran by EMS.

## 2018-03-30 NOTE — Progress Notes (Signed)
Patient trasfered from ED to 5W11 via stretcher; alert and oriented x 3; no complaints of pain; IV saline locked in LAC and blood running in LFA. Orient patient to room and unit; gave patient care guide; instructed how to use the call bell and  fall risk precautions; Warming blanket (bear hugger) was ordered; low bed in place; [facility]. Family at bedside. Will continue to monitor the patient.

## 2018-03-30 NOTE — ED Notes (Signed)
ED Provider at bedside. 

## 2018-03-30 NOTE — ED Provider Notes (Signed)
[facility] EMERGENCY DEPARTMENT Provider Note   CSN: [id number] Arrival date & time: 03/30/18  1331     History   Chief Complaint Chief Complaint  Patient presents with  . Loss of Consciousness    HPI Shane Weiss is a 84 y.o. male.  The history is provided by the patient and the EMS personnel. No language interpreter was used.  Loss of Consciousness       84 year old male with history of hypertension, prior stroke in 2002 brought here via EMS for evaluation of syncope.  Per EMS, they received a phone call from patient's family reporting that patient had a seizure activity.  Upon initial evaluation, patient had a syncopal episode with positional change.  He was found to be hypotensive with systolic blood pressure in the 80s.  He received 500 mg of IV fluid prior to arrival.  Currently patient is without any significant pain.  He did report passing out and admits to feeling weak.  No complaints of headache, vision changes, neck pain, chest pain, trouble breathing, abdominal pain, back pain, or pain to his extremities.  He denies any focal numbness or weakness.  He denies any medication changes recently.  He has been eating and drinking fine and sleeping fine.  He is at home living with his wife.  Past Medical History:  Diagnosis Date  . Hypertension   . Stroke [facility])    2002    There are no active problems to display for this patient.   History reviewed. No pertinent surgical history.      Home Medications    Prior to Admission medications   Medication Sig Start Date End Date Taking? Authorizing Provider  amLODipine (NORVASC) 10 MG tablet Take 10 mg by mouth daily. 08/18/17   [provider]  aspirin 81 MG tablet Take 81 mg by mouth daily.    [provider]  benazepril (LOTENSIN) 40 MG tablet Take 40 mg by mouth daily. 07/08/13   [provider]  ciprofloxacin (CIPRO) 500 MG tablet Take 1 tablet (500 mg total) by mouth 2  (two) times daily. 09/09/17   Little, Ambrose Finland, MD  dorzolamide-timolol (COSOPT) 22.3-6.8 MG/ML ophthalmic solution Place 1 drop into both eyes at bedtime. 08/30/17   [provider]  doxazosin (CARDURA) 8 MG tablet Take 8 mg by mouth at bedtime.  08/19/17   [provider]  metroNIDAZOLE (FLAGYL) 500 MG tablet Take 1 tablet (500 mg total) by mouth 2 (two) times daily. 09/09/17   Little, Ambrose Finland, MD    Family History No family history on file.  Social History Social History   Tobacco Use  . Smoking status: Not on file  Substance Use Topics  . Alcohol use: Not on file  . Drug use: Not on file     Allergies   Patient has no allergy information on record.   Review of Systems Review of Systems  Cardiovascular: Positive for syncope.  All other systems reviewed and are negative.    Physical Exam Updated Vital Signs BP (!) 106/58   Pulse 81   Resp 18   SpO2 97%   Physical Exam  Constitutional: He appears well-developed and well-nourished. No distress.  HENT:  Head: Normocephalic and atraumatic.  Mouth is dry.  No signs of tongue injury. Full dentures in place.  Eyes: Pupils are equal, round, and reactive to light. Conjunctivae are normal.  Neck: Normal range of motion. Neck supple. No JVD present.  Cardiovascular: Normal rate  and regular rhythm.  Murmur heard. Pulmonary/Chest: Effort normal and breath sounds normal. He has no wheezes. He has no rales.  Abdominal: Soft. He exhibits no distension. There is no tenderness.  Musculoskeletal:  Poor but equal strength throughout all 4 extremities with intact distal pulses.  Neurological: He is alert. He has normal strength. No cranial nerve deficit or sensory deficit. He exhibits normal muscle tone. GCS eye subscore is 4. GCS verbal subscore is 5. GCS motor subscore is 6.  Skin: No rash noted. There is pallor.  Smears of feces noted to feet and lower legs.  Psychiatric: He has a normal mood and  affect.  Nursing note and vitals reviewed.    ED Treatments / Results  Labs (all labs ordered are listed, but only abnormal results are displayed) Labs Reviewed  BASIC METABOLIC PANEL - Abnormal; Notable for the following components:      Result Value   Chloride 113 (*)    CO2 18 (*)    Glucose, Bld 154 (*)    Creatinine, Ser 1.84 (*)    Calcium 8.0 (*)    GFR calc non Af Amer 31 (*)    GFR calc Af Amer 36 (*)    All other components within normal limits  CBC WITH DIFFERENTIAL/PLATELET - Abnormal; Notable for the following components:   WBC 14.5 (*)    RBC 2.73 (*)    Hemoglobin 5.6 (*)    HCT 21.9 (*)    MCH 20.5 (*)    MCHC 25.6 (*)    RDW 19.3 (*)    Neutro Abs 13.2 (*)    Lymphs Abs 0.6 (*)    All other components within normal limits  IRON AND TIBC - Abnormal; Notable for the following components:   Iron 16 (*)    TIBC 231 (*)    Saturation Ratios 7 (*)    All other components within normal limits  FERRITIN - Abnormal; Notable for the following components:   Ferritin 8 (*)    All other components within normal limits  HEMOGLOBIN AND HEMATOCRIT, BLOOD - Abnormal; Notable for the following components:   Hemoglobin 6.0 (*)    HCT 23.5 (*)    All other components within normal limits  RETICULOCYTES - Abnormal; Notable for the following components:   RBC. 2.91 (*)    All other components within normal limits  CBG MONITORING, ED - Abnormal; Notable for the following components:   Glucose-Capillary 128 (*)    All other components within normal limits  POC OCCULT BLOOD, ED - Abnormal; Notable for the following components:   Fecal Occult Bld POSITIVE (*)    All other components within normal limits  MRSA PCR SCREENING  VITAMIN B12  FOLATE  LACTATE DEHYDROGENASE  SAVE SMEAR  URINALYSIS, ROUTINE W REFLEX MICROSCOPIC  HAPTOGLOBIN  PATHOLOGIST SMEAR REVIEW  BASIC METABOLIC PANEL  CBC  PROTIME-INR  I-STAT TROPONIN, ED  TYPE AND SCREEN  PREPARE RBC (CROSSMATCH)    ABO/RH    EKG EKG Interpretation  Date/Time:  Wednesday March 30 2018 13:47:44 EDT Ventricular Rate:  77 PR Interval:    QRS Duration: 91 QT Interval:  377 QTC Calculation: 427 R Axis:   23 Text Interpretation:  Sinus rhythm Low voltage, extremity leads no wpw, prolonged qt or brugada Otherwise no significant change Confirmed by Melene Plan [phone]) on 03/30/2018 1:56:34 PM   Radiology Ct Head Wo Contrast  Result Date: 03/30/2018 CLINICAL DATA:  Syncope, seizure-like activity EXAM: CT HEAD WITHOUT CONTRAST  TECHNIQUE: Contiguous axial images were obtained from the base of the skull through the vertex without intravenous contrast. COMPARISON:  None. FINDINGS: Brain: No evidence of acute infarction, hemorrhage, hydrocephalus, extra-axial collection or mass lesion/mass effect. Encephalomalacic changes related to prior right frontal infarct. Subcortical white matter and periventricular small vessel ischemic changes. Vascular: Mild intracranial atherosclerosis. Skull: Normal. Negative for fracture or focal lesion. Sinuses/Orbits: Mild partial opacification of the bilateral ethmoid sinuses. Mastoid air cells are clear. Other: None. IMPRESSION: No evidence of acute intracranial abnormality. Small vessel ischemic changes. Encephalomalacic changes related to prior right frontal infarct. Electronically Signed   By: Charline BillsSriyesh  Krishnan M.D.   On: 03/30/2018 15:38    Procedures .Critical Care Performed by: Fayrene Helperran, Sumayah Bearse, PA-C Authorized by: Fayrene Helperran, Merced Hanners, PA-C   Critical care provider statement:    Critical care time (minutes):  35   Critical care start time:  03/30/2018 2:05 PM   Critical care end time:  03/30/2018 3:44 PM   Critical care time was exclusive of:  Separately billable procedures and treating other patients   Critical care was necessary to treat or prevent imminent or life-threatening deterioration of the following conditions:  Circulatory failure   Critical care was time spent personally by  me on the following activities:  Blood draw for specimens, development of treatment plan with patient or surrogate, discussions with consultants, evaluation of patient's response to treatment, examination of patient, obtaining history from patient or surrogate, ordering and performing treatments and interventions, ordering and review of laboratory studies, pulse oximetry, re-evaluation of patient's condition and review of old charts   I assumed direction of critical care for this patient from another provider in my specialty: no     (including critical care time)  Medications Ordered in ED Medications  dorzolamide-timolol (COSOPT) 22.3-6.8 MG/ML ophthalmic solution 1 drop (1 drop Both Eyes Given 03/30/18 2147)  latanoprost (XALATAN) 0.005 % ophthalmic solution 1 drop (1 drop Both Eyes Given 03/30/18 2146)  0.9 %  sodium chloride infusion ( Intravenous Restarted 03/31/18 0038)  acetaminophen (TYLENOL) tablet 650 mg (has no administration in time range)    Or  acetaminophen (TYLENOL) suppository 650 mg (has no administration in time range)  ondansetron (ZOFRAN) tablet 4 mg (4 mg Oral Given 03/30/18 2046)    Or  ondansetron (ZOFRAN) injection 4 mg ( Intravenous See Alternative 03/30/18 2046)  feeding supplement (ENSURE ENLIVE) (ENSURE ENLIVE) liquid 237 mL (has no administration in time range)  sodium chloride 0.9 % bolus 1,000 mL (0 mLs Intravenous Stopped 03/30/18 1524)  0.9 %  sodium chloride infusion (0 mL/hr Intravenous Stopped 03/30/18 1639)  gi cocktail (Maalox,Lidocaine,Donnatal) (30 mLs Oral Given 03/30/18 2330)     Initial Impression / Assessment and Plan / ED Course  I have reviewed the triage vital signs and the nursing notes.  Pertinent labs & imaging results that were available during my care of the patient were reviewed by me and considered in my medical decision making (see chart for details).     BP (!) 116/42 (BP Location: Right Arm)   Pulse 80   Temp 98.1 F (36.7 C) (Oral)    Resp 17   Ht 5\' 6"  (1.676 m)   Wt 56.7 kg (125 lb) Comment: stated weight  SpO2 94%   BMI 20.18 kg/m    Final Clinical Impressions(s) / ED Diagnoses   Final diagnoses:  Symptomatic anemia  Vasovagal syncope    ED Discharge Orders    None     2:05 PM EMS  report patient had a witnessed seizure family member and he had a near syncopal episode with positional changes.  He was found to be hypotensive on the scene.  His current blood pressure is 95/37.  He does appear dry.  IV fluid given.  Work-up initiated.  No focal neuro deficit noted on initial exam. Care discussed with Dr. Adela Lank.  3:45 PM Hemoglobin at 5.6 which is a significant drop from 12.5 six months ago.  Anemia likely account for patient's generalized weakness and recurrent falls.  Family member mention that he has been weak for several months and has had recurrent falls.  No abnormal bleeding that they have noticed.  An anemia panel was initiated, will give blood transfusion and will consult for admission.  Appreciate consultation from [facility] who agrees to admit pt for further management of symptomatic anemia.    Fayrene Helper, PA-C 03/31/18 0606    Melene Plan, DO 04/01/18 1753

## 2018-03-31 ENCOUNTER — Encounter (HOSPITAL_COMMUNITY): Payer: Self-pay | Admitting: Gastroenterology

## 2018-03-31 DIAGNOSIS — A0472 Enterocolitis due to Clostridium difficile, not specified as recurrent: Secondary | ICD-10-CM

## 2018-03-31 DIAGNOSIS — R262 Difficulty in walking, not elsewhere classified: Secondary | ICD-10-CM

## 2018-03-31 DIAGNOSIS — R55 Syncope and collapse: Secondary | ICD-10-CM

## 2018-03-31 LAB — BPAM RBC
Blood Product Expiration Date: 201908062359
Blood Product Expiration Date: 201908062359
ISSUE DATE / TIME: 201907172233
ISSUE DATE / TIME: 201907171630
UNIT TYPE AND RH: 6200
Unit Type and Rh: 6200

## 2018-03-31 LAB — BASIC METABOLIC PANEL
Anion gap: 7 (ref 5–15)
BUN: 23 mg/dL (ref 8–23)
CALCIUM: 7.8 mg/dL — AB (ref 8.9–10.3)
CO2: 22 mmol/L (ref 22–32)
CREATININE: 1.59 mg/dL — AB (ref 0.61–1.24)
Chloride: 112 mmol/L — ABNORMAL HIGH (ref 98–111)
GFR calc Af Amer: 42 mL/min — ABNORMAL LOW (ref >60)
GFR calc non Af Amer: 37 mL/min — ABNORMAL LOW (ref >60)
Glucose, Bld: 120 mg/dL — ABNORMAL HIGH (ref 70–99)
Potassium: 4.2 mmol/L (ref 3.5–5.1)
Sodium: 141 mmol/L (ref 135–145)

## 2018-03-31 LAB — CLOSTRIDIUM DIFFICILE BY PCR, REFLEXED: CDIFFPCR: POSITIVE — AB

## 2018-03-31 LAB — PROTIME-INR
INR: 1.31
Prothrombin Time: 16.2 seconds — ABNORMAL HIGH (ref 11.4–15.2)

## 2018-03-31 LAB — TYPE AND SCREEN
ABO/RH(D): A POS
ANTIBODY SCREEN: NEGATIVE
UNIT DIVISION: 0
UNIT DIVISION: 0

## 2018-03-31 LAB — C DIFFICILE QUICK SCREEN W PCR REFLEX
C Diff antigen: POSITIVE — AB
C Diff toxin: NEGATIVE

## 2018-03-31 LAB — MRSA PCR SCREENING: MRSA by PCR: NEGATIVE

## 2018-03-31 LAB — CBC
HCT: 30.2 % — ABNORMAL LOW (ref 39.0–52.0)
Hemoglobin: 8.8 g/dL — ABNORMAL LOW (ref 13.0–17.0)
MCH: 23.3 pg — AB (ref 26.0–34.0)
MCHC: 29.1 g/dL — ABNORMAL LOW (ref 30.0–36.0)
MCV: 80.1 fL (ref 78.0–100.0)
Platelets: 297 10*3/uL (ref 150–400)
RBC: 3.77 MIL/uL — ABNORMAL LOW (ref 4.22–5.81)
RDW: 18.2 % — AB (ref 11.5–15.5)
WBC: 16.6 10*3/uL — ABNORMAL HIGH (ref 4.0–10.5)

## 2018-03-31 LAB — PATHOLOGIST SMEAR REVIEW

## 2018-03-31 LAB — GLUCOSE, CAPILLARY: Glucose-Capillary: 113 mg/dL — ABNORMAL HIGH (ref 70–99)

## 2018-03-31 LAB — HAPTOGLOBIN: Haptoglobin: 346 mg/dL — ABNORMAL HIGH (ref 34–200)

## 2018-03-31 MED ORDER — VANCOMYCIN 50 MG/ML ORAL SOLUTION: 125.0000 mg | Freq: Two times a day (BID) | ORAL | Status: DC

## 2018-03-31 MED ORDER — VANCOMYCIN 50 MG/ML ORAL SOLUTION: 125.0000 mg | Freq: Four times a day (QID) | ORAL | Status: DC

## 2018-03-31 MED ORDER — VANCOMYCIN 50 MG/ML ORAL SOLUTION: 125.0000 mg | ORAL | Status: DC

## 2018-03-31 MED ORDER — VANCOMYCIN 50 MG/ML ORAL SOLUTION: 125.0000 mg | Freq: Every day | ORAL | Status: DC

## 2018-03-31 MED ORDER — VANCOMYCIN 50 MG/ML ORAL SOLUTION
125.0000 mg | Freq: Four times a day (QID) | ORAL | Status: DC
  Administered 2018-03-31 – 2018-04-02 (×7): 125 mg via ORAL
  Filled 2018-03-31 (×10): qty 2.5

## 2018-03-31 MED ORDER — FERROUS SULFATE 325 (65 FE) MG PO TABS
325.0000 mg | ORAL_TABLET | Freq: Every day | ORAL | Status: DC
  Administered 2018-03-31 – 2018-04-02 (×3): 325 mg via ORAL
  Filled 2018-03-31 (×3): qty 1

## 2018-03-31 NOTE — Progress Notes (Signed)
Initial Nutrition Assessment  DOCUMENTATION CODES:   Not applicable  INTERVENTION:    Ensure Enlive po BID, each supplement provides 350 kcal and 20 grams of protein  NUTRITION DIAGNOSIS:   Increased nutrient needs related to acute illness as evidenced by estimated needs  GOAL:   Patient will meet greater than or equal to 90% of their needs  MONITOR:   PO intake, Supplement acceptance, Labs, Weight trends, Skin, I & O's  REASON FOR ASSESSMENT:   Malnutrition Screening Tool  ASSESSMENT:   84 y.o. Male with PMH significant of CVA/HTN.  He lives at home with his wife and was in his usual health until May per the wife when he began to fall, have decreased energy, and weakness.   Patient admitted with anemia.  RD unable to obtain nutrition hx.  Laying in bed in the dark. His speech is unintelligible.  No % PO intake records available at this time.  Ensure Enlive supplements ordered this AM. Labs and medications reviewed. CBG's 128-113.  NUTRITION - FOCUSED PHYSICAL EXAM:  Unable to complete at this time. Suspect possible malnutrition.  Diet Order:   Diet Order           DIET SOFT Room service appropriate? Yes; Fluid consistency: Thin  Diet effective now         EDUCATION NEEDS:   Not appropriate for education at this time  Skin:  Skin Assessment: Reviewed RN Assessment  Last BM:  7/18  Height:   Ht Readings from Last 1 Encounters:  03/30/18 5\' 6"  (1.676 m)   Weight:   Wt Readings from Last 1 Encounters:  03/30/18 125 lb (56.7 kg)   BMI:  Body mass index is 20.18 kg/m.  Estimated Nutritional Needs:   Kcal:  1250-1450  Protein:  60-75 gm  Fluid:  >/= 1.5 L  Maureen ChattersKatie Brenna Friesenhahn, RD, LDN Pager #: [phone] After-Hours Pager #: [phone]

## 2018-03-31 NOTE — Consult Note (Signed)
Reason for Consult:guaiac positive anemia Referring Physician: Hospital team  Shane Weiss is an 84 y.o. male.  HPI: patient seen and examined and our office computer chart and the hospital computer chart was reviewed and case discussed with the hospital team as well as his wife and his been iron deficient for a while but his iron was stopped about a year ago when his hemoglobin was better and he is on an aspirin a day but has not seen any blood in his bowels or urine or from anywhere else and other than his periodic bouts of diarrhea and currently he has C. Difficile he has no other GI complaints and his family history is negative from a GI standpoint and we had a long talk about possible workup or not and he has no other complaints  Past Medical History:  Diagnosis Date  . History of Bell's palsy    /notes 01/28/2011  . History of blood transfusion 03/30/2018   "low blood"   . Hypertension   . Stroke [facility]) 2002   denies deficits on 03/30/2018    Past Surgical History:  Procedure Laterality Date  . CAROTID ENDARTERECTOMY Right 10/2001   with Dacron patch angioplasty./notes 01/28/2011  . CATARACT EXTRACTION, BILATERAL Bilateral   . MASS EXCISION     "Bible bump on my scalp; had it removed"    History reviewed. No pertinent family history.  Social History:  reports that he has never smoked. He has never used smokeless tobacco. He reports that he does not drink alcohol or use drugs.  Allergies: No Known Allergies  Medications: I have reviewed the patient's current medications.  Results for orders placed or performed during the hospital encounter of 03/30/18 (from the past 48 hour(s))  Save smear     Status: None   Collection Time: 03/30/18  2:01 PM  Result Value Ref Range   Smear Review SMEAR STAINED AND AVAILABLE FOR REVIEW     Comment: Performed at [facility], [place] [address], [place], [place] [address]  Pathologist smear review     Status: None   Collection Time:  03/30/18  2:01 PM  Result Value Ref Range   Path Review NEUTOPHILIA AND HYPOCHROMIC ANEMIA     Comment: Reviewed by Lennox Solders. Lyndon Code, M.D. 03/31/18 1258 DAVISB Performed at [facility], [place] [address], [place], [place] [address]   Hemoglobin and hematocrit, blood     Status: Abnormal   Collection Time: 03/30/18  2:01 PM  Result Value Ref Range   Hemoglobin 6.0 (LL) 13.0 - 17.0 g/dL    Comment: REPEATED TO VERIFY CRITICAL RESULT CALLED TO, READ BACK BY AND VERIFIED WITH: BLowella Bandy RN [id number] GREEN R    HCT 23.5 (L) 39.0 - 52.0 %    Comment: Performed at [facility] [address] [place], [place] [address]  Reticulocytes     Status: Abnormal   Collection Time: 03/30/18  2:01 PM  Result Value Ref Range   Retic Ct Pct 1.5 0.4 - 3.1 %   RBC. 2.91 (L) 4.22 - 5.81 MIL/uL   Retic Count, Absolute 43.7 19.0 - 186.0 K/uL    Comment: Performed at [facility] [address] [place], [place] [address]  Basic metabolic panel     Status: Abnormal   Collection Time: 03/30/18  2:34 PM  Result Value Ref Range   Sodium 142 135 - 145 mmol/L   Potassium 4.7 3.5 - 5.1 mmol/L  Chloride 113 (H) 98 - 111 mmol/L    Comment: Please note change in reference range.   CO2 18 (L) 22 - 32 mmol/L   Glucose, Bld 154 (H) 70 - 99 mg/dL    Comment: Please note change in reference range.   BUN 22 8 - 23 mg/dL    Comment: Please note change in reference range.   Creatinine, Ser 1.84 (H) 0.61 - 1.24 mg/dL   Calcium 8.0 (L) 8.9 - 10.3 mg/dL   GFR calc non Af Amer 31 (L) >60 mL/min   GFR calc Af Amer 36 (L) >60 mL/min    Comment: (NOTE) The eGFR has been calculated using the CKD EPI equation. This calculation has not been validated in all clinical situations. eGFR's persistently <60 mL/min signify possible Chronic Kidney Disease.    Anion gap 11 5 - 15    Comment: Performed at [facility] [address], [place], [place] [address]  CBC WITH DIFFERENTIAL     Status:  Abnormal   Collection Time: 03/30/18  2:34 PM  Result Value Ref Range   WBC 14.5 (H) 4.0 - 10.5 K/uL   RBC 2.73 (L) 4.22 - 5.81 MIL/uL   Hemoglobin 5.6 (LL) 13.0 - 17.0 g/dL    Comment: REPEATED TO VERIFY CRITICAL RESULT CALLED TO, READ BACK BY AND VERIFIED WITH: M.STANHOPE,RN @ 1515 03/30/2018 WEBBERJ    HCT 21.9 (L) 39.0 - 52.0 %   MCV 80.2 78.0 - 100.0 fL   MCH 20.5 (L) 26.0 - 34.0 pg   MCHC 25.6 (L) 30.0 - 36.0 g/dL   RDW 19.3 (H) 11.5 - 15.5 %   Platelets 281 150 - 400 K/uL   Neutrophils Relative % 91 %   Neutro Abs 13.2 (H) 1.7 - 7.7 K/uL   Lymphocytes Relative 4 %   Lymphs Abs 0.6 (L) 0.7 - 4.0 K/uL   Monocytes Relative 4 %   Monocytes Absolute 0.6 0.1 - 1.0 K/uL   Eosinophils Relative 0 %   Eosinophils Absolute 0.0 0.0 - 0.7 K/uL   Basophils Relative 0 %   Basophils Absolute 0.0 0.0 - 0.1 K/uL   Immature Granulocytes 1 %   Abs Immature Granulocytes 0.1 0.0 - 0.1 K/uL    Comment: Performed at [facility], [place] [address], [place], [place] [address]  Type and screen     Status: None   Collection Time: 03/30/18  2:34 PM  Result Value Ref Range   ABO/RH(D) A POS    Antibody Screen NEG    Sample Expiration 04/02/2018    Unit Number [id number]    Blood Component Type RED CELLS,LR    Unit division 00    Status of Unit ISSUED,FINAL    Transfusion Status OK TO TRANSFUSE    Crossmatch Result      Compatible Performed at [facility], [place] [address] [place], [place] [address]    Unit Number [id number]    Blood Component Type RED CELLS,LR    Unit division 00    Status of Unit ISSUED,FINAL    Transfusion Status OK TO TRANSFUSE    Crossmatch Result Compatible   ABO/Rh     Status: None   Collection Time: 03/30/18  2:34 PM  Result Value Ref Range   ABO/RH(D)      A POS Performed at [facility], [place] [address] [place], [place] [address]   I-stat troponin, ED     Status: None   Collection  Time: 03/30/18  2:42 PM  Result Value Ref Range    Troponin i, poc 0.00 0.00 - 0.08 ng/mL   Comment 3            Comment: Due to the release kinetics of cTnI, a negative result within the first hours of the onset of symptoms does not rule out myocardial infarction with certainty. If myocardial infarction is still suspected, repeat the test at appropriate intervals.   Prepare RBC     Status: None   Collection Time: 03/30/18  3:34 PM  Result Value Ref Range   Order Confirmation      ORDER PROCESSED BY BLOOD BANK Performed at [facility], [place] [address] [place], [place] [address]   Vitamin B12     Status: None   Collection Time: 03/30/18  3:58 PM  Result Value Ref Range   Vitamin B-12 222 180 - 914 pg/mL    Comment: (NOTE) This assay is not validated for testing neonatal or myeloproliferative syndrome specimens for Vitamin B12 levels. Performed at [facility], [place] [address] [place], [place] [address]   Folate     Status: None   Collection Time: 03/30/18  3:58 PM  Result Value Ref Range   Folate 14.8 >5.9 ng/mL    Comment: Performed at [facility], [place] [address], [place], [place] [address]  Iron and TIBC     Status: Abnormal   Collection Time: 03/30/18  3:58 PM  Result Value Ref Range   Iron 16 (L) 45 - 182 ug/dL   TIBC 231 (L) 250 - 450 ug/dL   Saturation Ratios 7 (L) 17.9 - 39.5 %   UIBC 215 ug/dL    Comment: Performed at [facility] [address] [place], [place] [address]  Ferritin     Status: Abnormal   Collection Time: 03/30/18  3:58 PM  Result Value Ref Range   Ferritin 8 (L) 24 - 336 ng/mL    Comment: Performed at [facility], [place] [address] [place], [place] [address]  Lactate dehydrogenase     Status: None   Collection Time: 03/30/18  4:18 PM  Result Value Ref Range   LDH 184 98 - 192 U/L    Comment: Performed at [facility], [place] [address], [place], [place] [address]  Haptoglobin     Status: Abnormal   Collection Time: 03/30/18  4:18 PM  Result Value Ref Range    Haptoglobin 346 (H) 34 - 200 mg/dL    Comment: (NOTE) Performed At: [facility] [place], [place] [id number] Rush Farmer MD CL:[id number]   CBG monitoring, ED     Status: Abnormal   Collection Time: 03/30/18  4:32 PM  Result Value Ref Range   Glucose-Capillary 128 (H) 70 - 99 mg/dL   Comment 1 Notify RN    Comment 2 Document in Chart   POC occult blood, ED RN will collect     Status: Abnormal   Collection Time: 03/30/18  4:42 PM  Result Value Ref Range   Fecal Occult Bld POSITIVE (A) NEGATIVE  MRSA PCR Screening     Status: None   Collection Time: 03/30/18  8:20 PM  Result Value Ref Range   MRSA by PCR NEGATIVE NEGATIVE    Comment:        The GeneXpert MRSA Assay (FDA approved for NASAL specimens only), is one component of a comprehensive MRSA colonization surveillance program. It  is not intended to diagnose MRSA infection nor to guide or monitor treatment for MRSA infections. Performed at [facility], [place] [address], [place], [place] [address]   Basic metabolic panel     Status: Abnormal   Collection Time: 03/31/18  5:06 AM  Result Value Ref Range   Sodium 141 135 - 145 mmol/L   Potassium 4.2 3.5 - 5.1 mmol/L   Chloride 112 (H) 98 - 111 mmol/L    Comment: Please note change in reference range.   CO2 22 22 - 32 mmol/L   Glucose, Bld 120 (H) 70 - 99 mg/dL    Comment: Please note change in reference range.   BUN 23 8 - 23 mg/dL    Comment: Please note change in reference range.   Creatinine, Ser 1.59 (H) 0.61 - 1.24 mg/dL   Calcium 7.8 (L) 8.9 - 10.3 mg/dL   GFR calc non Af Amer 37 (L) >60 mL/min   GFR calc Af Amer 42 (L) >60 mL/min    Comment: (NOTE) The eGFR has been calculated using the CKD EPI equation. This calculation has not been validated in all clinical situations. eGFR's persistently <60 mL/min signify possible Chronic Kidney Disease.    Anion gap 7 5 - 15    Comment: Performed at [facility] [address], [place], [place] [address]  CBC     Status: Abnormal   Collection Time: 03/31/18  5:06 AM  Result Value Ref Range   WBC 16.6 (H) 4.0 - 10.5 K/uL   RBC 3.77 (L) 4.22 - 5.81 MIL/uL   Hemoglobin 8.8 (L) 13.0 - 17.0 g/dL    Comment: DELTA CHECK NOTED POST TRANSFUSION SPECIMEN    HCT 30.2 (L) 39.0 - 52.0 %   MCV 80.1 78.0 - 100.0 fL   MCH 23.3 (L) 26.0 - 34.0 pg   MCHC 29.1 (L) 30.0 - 36.0 g/dL   RDW 18.2 (H) 11.5 - 15.5 %   Platelets 297 150 - 400 K/uL    Comment: Performed at [facility], [place] [address], [place], [place] [address]  Protime-INR     Status: Abnormal   Collection Time: 03/31/18  5:06 AM  Result Value Ref Range   Prothrombin Time 16.2 (H) 11.4 - 15.2 seconds   INR 1.31     Comment: Performed at [facility] [address], [place], [place] [address]  Glucose, capillary     Status: Abnormal   Collection Time: 03/31/18  7:58 AM  Result Value Ref Range   Glucose-Capillary 113 (H) 70 - 99 mg/dL  C difficile quick scan w PCR reflex     Status: Abnormal   Collection Time: 03/31/18 10:40 AM  Result Value Ref Range   C Diff antigen POSITIVE (A) NEGATIVE   C Diff toxin NEGATIVE NEGATIVE   C Diff interpretation Results are indeterminate. See PCR results.   C. Diff by PCR, Reflexed     Status: Abnormal   Collection Time: 03/31/18 10:40 AM  Result Value Ref Range   Toxigenic C. Difficile by PCR POSITIVE (A) NEGATIVE    Comment: Positive for toxigenic C. difficile with little to no toxin production. Only treat if clinical presentation suggests symptomatic illness. Performed at [facility], [place] [address] [place], [place] [address]     Ct Head Wo Contrast  Result Date: 03/30/2018 CLINICAL DATA:  Syncope, seizure-like activity EXAM: CT HEAD WITHOUT CONTRAST TECHNIQUE: Contiguous axial images were obtained from  the base of the skull through the vertex without intravenous contrast. COMPARISON:  None. FINDINGS: Brain: No evidence of acute infarction,  hemorrhage, hydrocephalus, extra-axial collection or mass lesion/mass effect. Encephalomalacic changes related to prior right frontal infarct. Subcortical white matter and periventricular small vessel ischemic changes. Vascular: Mild intracranial atherosclerosis. Skull: Normal. Negative for fracture or focal lesion. Sinuses/Orbits: Mild partial opacification of the bilateral ethmoid sinuses. Mastoid air cells are clear. Other: None. IMPRESSION: No evidence of acute intracranial abnormality. Small vessel ischemic changes. Encephalomalacic changes related to prior right frontal infarct. Electronically Signed   By: Julian Hy M.D.   On: 03/30/2018 15:38    ROSnegative except above Blood pressure (!) 128/48, pulse 98, temperature 99.7 F (37.6 C), temperature source Rectal, resp. rate (!) 22, height '5\' 6"'$  (1.676 m), weight 56.7 kg (125 lb), SpO2 96 %. 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 Difficile is eradicated and when better repeat CT scan to make sure the finding on December CT has resolved and make sure he does not need a flexible sigmoidoscopy or colonoscopy just to be sure and they agree with the above plan  [facility] E 03/31/2018, 4:48 PM

## 2018-03-31 NOTE — Progress Notes (Signed)
PROGRESS NOTE  [name]:[id number] DOB: 1925/02/08 DOA: 03/30/2018 PCP: Johny Blamer, MD  HPI/Recap of past 24 hours:  Shane Weiss is a 84 y.o. male with medical history significant of CVA/HTN.  He lives at home with his wife and was in his usual health until May per the wife when he began to fall, have decreased energy, and weakness.  Today he had 2 reported syncopal episodes and reported seizure like activity.  Both syncopal episodes were with position changes.  He was found to be hypotensive and was given 500 cc by EMS.  Family reports he has been c/o visual troubles.   In the ER, he was found to be incontinent of stools and covered in feces.  BP was 95/37 and IVF was given with some improvement. Lab studies remarkable for severe anemia with hemoglobin of 5.6 and positive FOBT.   Elevated haptoglobin and low LDH with low suspicion for hemolysis.  GI consulted Dr. Ewing Schlein.  04/01/18: Seen and examined with wife and daughter at his bedside. Reports persistent diarrhea. C-diff antigen positive. Started on po vancomycin.      Assessment/Plan: Active Problems:   Anemia   H/O: CVA (cerebrovascular accident)   HTN (hypertension)   CKD (chronic kidney disease), stage IV (HCC)  Presyncope most likely secondary to dehydration versus severe anemia Patient reports his legs gave out prior to falling Obtain orthostatic vital signs Dehydration from C. difficile diarrhea Anemia due to suspected GI bleed  New C. difficile diarrhea Start p.o. vancomycin 4 times daily x14 days Monitor stool output Monitor for signs of dehydration Continue IV fluid Monitor fever curve Monitor CBC, WBC  Severe anemia multifactorial from acute blood loss with suspected GI bleed and iron deficiency Positive FOBT Low LDH and elevated haptoglobin-low suspicion for hemolysis Start ferrous sulfate supplement Hemoglobin on presentation 5.6 Status post 2 units PRBC transfused Hemoglobin on 03/31/2018 is  8.8 Monitor for any sign of overt bleeding GI consulted Dr. Ewing Schlein Repeat CBC in the morning  AKI on CKD 4, improving Creatinine on presentation 1.84 Baseline creatinine 1.5 Creatinine today 1.59 GFR 37 Avoid nephrotoxic agents/dehydration/hypotension Repeat BMP in the morning  Generalized weakness/ambulatory dysfunction Previous CVA PT to assess when more stable Fall precautions  History of CVA Noted on CT head with no contrast Confirmed by wife which occurred around 2003 Hold aspirin due to possible procedure and suspected GI bleed    Code Status: Full code  Family Communication: Wife and daughter at bedside  Disposition Plan: Home when clinically stable and GI signs of in 2 to 3 days   Consultants:  GI  Procedures:  None  Antimicrobials:  P.o. vancomycin  DVT prophylaxis: SCDs.  Chemical DVT prophylaxis currently contraindicated due to suspected GI bleed.   Objective: Vitals:   03/31/18 0150 03/31/18 0355 03/31/18 0730 03/31/18 1134  BP: (!) 121/38 (!) 116/42 (!) 121/43 (!) 114/38  Pulse: 69 80 81 85  Resp: 13 17 19  (!) 24  Temp: 98.1 F (36.7 C) 98.1 F (36.7 C) 99 F (37.2 C) 98.4 F (36.9 C)  TempSrc: Oral Oral Rectal Oral  SpO2: 97% 94% 96% 95%  Weight:      Height:        Intake/Output Summary (Last 24 hours) at 03/31/2018 1236 Last data filed at 03/31/2018 1133 Gross per 24 hour  Intake 1509.82 ml  Output -  Net 1509.82 ml   Filed Weights   03/30/18 2013  Weight: 56.7 kg (125 lb)    Exam:  .  General: 84 y.o. year-old male frail in no acute distress.  He is alert and oriented x3.   . Cardiovascular: Regular rate and rhythm with no rubs or gallops.  No thyromegaly or JVD noted.   Marland Kitchen Respiratory: Clear to auscultation with no wheezes or rales. Good inspiratory effort. . Abdomen: Soft nontender nondistended with normal bowel sounds x4 quadrants. . Musculoskeletal: No lower extremity edema. 2/4 pulses in all 4  extremities. Marland Kitchen Psychiatry: Mood is appropriate for condition and setting   Data Reviewed: CBC: Recent Labs  Lab 03/30/18 1401 03/30/18 1434 03/31/18 0506  WBC  --  14.5* 16.6*  NEUTROABS  --  13.2*  --   HGB 6.0* 5.6* 8.8*  HCT 23.5* 21.9* 30.2*  MCV  --  80.2 80.1  PLT  --  281 297   Basic Metabolic Panel: Recent Labs  Lab 03/30/18 1434 03/31/18 0506  NA 142 141  K 4.7 4.2  CL 113* 112*  CO2 18* 22  GLUCOSE 154* 120*  BUN 22 23  CREATININE 1.84* 1.59*  CALCIUM 8.0* 7.8*   GFR: Estimated Creatinine Clearance: 24.8 mL/min (A) (by C-G formula based on SCr of 1.59 mg/dL (H)). Liver Function Tests: No results for input(s): AST, ALT, ALKPHOS, BILITOT, PROT, ALBUMIN in the last 168 hours. No results for input(s): LIPASE, AMYLASE in the last 168 hours. No results for input(s): AMMONIA in the last 168 hours. Coagulation Profile: Recent Labs  Lab 03/31/18 0506  INR 1.31   Cardiac Enzymes: No results for input(s): CKTOTAL, CKMB, CKMBINDEX, TROPONINI in the last 168 hours. BNP (last 3 results) No results for input(s): PROBNP in the last 8760 hours. HbA1C: No results for input(s): HGBA1C in the last 72 hours. CBG: Recent Labs  Lab 03/30/18 1632 03/31/18 0758  GLUCAP 128* 113*   Lipid Profile: No results for input(s): CHOL, HDL, LDLCALC, TRIG, CHOLHDL, LDLDIRECT in the last 72 hours. Thyroid Function Tests: No results for input(s): TSH, T4TOTAL, FREET4, T3FREE, THYROIDAB in the last 72 hours. Anemia Panel: Recent Labs    03/30/18 1401 03/30/18 1558  VITAMINB12  --  222  FOLATE  --  14.8  FERRITIN  --  8*  TIBC  --  231*  IRON  --  16*  RETICCTPCT 1.5  --    Urine analysis:    Component Value Date/Time   COLORURINE YELLOW 09/08/2017 2120   APPEARANCEUR HAZY (A) 09/08/2017 2120   LABSPEC 1.010 09/08/2017 2120   PHURINE 5.0 09/08/2017 2120   GLUCOSEU NEGATIVE 09/08/2017 2120   HGBUR MODERATE (A) 09/08/2017 2120   BILIRUBINUR NEGATIVE 09/08/2017 2120    KETONESUR NEGATIVE 09/08/2017 2120   PROTEINUR NEGATIVE 09/08/2017 2120   NITRITE NEGATIVE 09/08/2017 2120   LEUKOCYTESUR NEGATIVE 09/08/2017 2120   Sepsis Labs: @LABRCNTIP (procalcitonin:4,lacticidven:4)  ) Recent Results (from the past 240 hour(s))  MRSA PCR Screening     Status: None   Collection Time: 03/30/18  8:20 PM  Result Value Ref Range Status   MRSA by PCR NEGATIVE NEGATIVE Final    Comment:        The GeneXpert MRSA Assay (FDA approved for NASAL specimens only), is one component of a comprehensive MRSA colonization surveillance program. It is not intended to diagnose MRSA infection nor to guide or monitor treatment for MRSA infections. Performed at [facility], [address] [place], [place] [address]   C difficile quick scan w PCR reflex     Status: Abnormal   Collection Time: 03/31/18 10:40 AM  Result Value Ref Range Status   C Diff antigen POSITIVE (A) NEGATIVE Final   C Diff toxin NEGATIVE NEGATIVE Final   C Diff interpretation Results are indeterminate. See PCR results.  Final      Studies: Ct Head Wo Contrast  Result Date: 03/30/2018 CLINICAL DATA:  Syncope, seizure-like activity EXAM: CT HEAD WITHOUT CONTRAST TECHNIQUE: Contiguous axial images were obtained from the base of the skull through the vertex without intravenous contrast. COMPARISON:  None. FINDINGS: Brain: No evidence of acute infarction, hemorrhage, hydrocephalus, extra-axial collection or mass lesion/mass effect. Encephalomalacic changes related to prior right frontal infarct. Subcortical white matter and periventricular small vessel ischemic changes. Vascular: Mild intracranial atherosclerosis. Skull: Normal. Negative for fracture or focal lesion. Sinuses/Orbits: Mild partial opacification of the bilateral ethmoid sinuses. Mastoid air cells are clear. Other: None. IMPRESSION: No evidence of acute intracranial abnormality. Small vessel ischemic changes. Encephalomalacic changes related  to prior right frontal infarct. Electronically Signed   By: Charline BillsSriyesh  Krishnan M.D.   On: 03/30/2018 15:38    Scheduled Meds: . dorzolamide-timolol  1 drop Both Eyes QHS  . feeding supplement (ENSURE ENLIVE)  237 mL Oral BID BM  . ferrous sulfate  325 mg Oral Q breakfast  . latanoprost  1 drop Both Eyes QHS  . vancomycin  125 mg Oral QID    Continuous Infusions: . sodium chloride 1,000 mL (03/31/18 1133)     LOS: 1 day     Darlin Droparole N Zephan Beauchaine, MD [facility] Pager [phone]  If 7PM-7AM, please contact night-coverage [email or web address] Password [email or web address] 03/31/2018, 12:36 PM

## 2018-03-31 NOTE — Progress Notes (Signed)
Informed by central tele at approximately 0139 that patient had a 7-beat run of v tach.  On assessment, patient is asymptomatic and resting comfortably.  Monitor currently shows a normal sinus rhythm.  Will continue to monitor.

## 2018-04-01 ENCOUNTER — Inpatient Hospital Stay (HOSPITAL_COMMUNITY): Payer: Medicare Other | Admitting: Anesthesiology

## 2018-04-01 ENCOUNTER — Encounter (HOSPITAL_COMMUNITY): Payer: Self-pay | Admitting: Anesthesiology

## 2018-04-01 ENCOUNTER — Encounter (HOSPITAL_COMMUNITY)
Admission: EM | Disposition: A | Discharge status: Home Health | Payer: Self-pay | Source: Home / Self Care | Attending: Internal Medicine

## 2018-04-01 DIAGNOSIS — D500 Iron deficiency anemia secondary to blood loss (chronic): Secondary | ICD-10-CM

## 2018-04-01 DIAGNOSIS — R262 Difficulty in walking, not elsewhere classified: Secondary | ICD-10-CM

## 2018-04-01 DIAGNOSIS — D649 Anemia, unspecified: Secondary | ICD-10-CM

## 2018-04-01 HISTORY — PX: ESOPHAGOGASTRODUODENOSCOPY (EGD) WITH PROPOFOL: SHX5813

## 2018-04-01 LAB — URINALYSIS, ROUTINE W REFLEX MICROSCOPIC
Bacteria, UA: NONE SEEN
Bilirubin Urine: NEGATIVE
GLUCOSE, UA: NEGATIVE mg/dL
HGB URINE DIPSTICK: NEGATIVE
Ketones, ur: NEGATIVE mg/dL
LEUKOCYTES UA: NEGATIVE
Nitrite: NEGATIVE
Protein, ur: 30 mg/dL — AB
SPECIFIC GRAVITY, URINE: 1.02 (ref 1.005–1.030)
pH: 8 (ref 5.0–8.0)

## 2018-04-01 LAB — CBC
HCT: 28.3 % — ABNORMAL LOW (ref 39.0–52.0)
Hemoglobin: 8.2 g/dL — ABNORMAL LOW (ref 13.0–17.0)
MCH: 23.4 pg — AB (ref 26.0–34.0)
MCHC: 29 g/dL — ABNORMAL LOW (ref 30.0–36.0)
MCV: 80.6 fL (ref 78.0–100.0)
PLATELETS: 279 10*3/uL (ref 150–400)
RBC: 3.51 MIL/uL — ABNORMAL LOW (ref 4.22–5.81)
RDW: 19.1 % — AB (ref 11.5–15.5)
WBC: 13.9 10*3/uL — ABNORMAL HIGH (ref 4.0–10.5)

## 2018-04-01 LAB — BASIC METABOLIC PANEL
Anion gap: 8 (ref 5–15)
BUN: 20 mg/dL (ref 8–23)
CALCIUM: 7.7 mg/dL — AB (ref 8.9–10.3)
CO2: 21 mmol/L — ABNORMAL LOW (ref 22–32)
CREATININE: 1.3 mg/dL — AB (ref 0.61–1.24)
Chloride: 114 mmol/L — ABNORMAL HIGH (ref 98–111)
GFR calc Af Amer: 54 mL/min — ABNORMAL LOW (ref >60)
GFR, EST NON AFRICAN AMERICAN: 47 mL/min — AB (ref >60)
Glucose, Bld: 107 mg/dL — ABNORMAL HIGH (ref 70–99)
POTASSIUM: 4 mmol/L (ref 3.5–5.1)
SODIUM: 143 mmol/L (ref 135–145)

## 2018-04-01 SURGERY — ESOPHAGOGASTRODUODENOSCOPY (EGD) WITH PROPOFOL
Anesthesia: Monitor Anesthesia Care

## 2018-04-01 MED ORDER — SODIUM CHLORIDE 0.9 % IV SOLN
INTRAVENOUS | Status: DC | PRN
  Administered 2018-04-01: 10:00:00 via INTRAVENOUS

## 2018-04-01 MED ORDER — PROPOFOL 10 MG/ML IV BOLUS
INTRAVENOUS | Status: DC | PRN
  Administered 2018-04-01: 30 mg via INTRAVENOUS

## 2018-04-01 MED ORDER — LIDOCAINE HCL (CARDIAC) PF 100 MG/5ML IV SOSY
PREFILLED_SYRINGE | INTRAVENOUS | Status: DC | PRN
  Administered 2018-04-01: 30 mg via INTRAVENOUS

## 2018-04-01 MED ORDER — FAMOTIDINE 20 MG PO TABS
20.0000 mg | ORAL_TABLET | Freq: Two times a day (BID) | ORAL | Status: DC
  Administered 2018-04-01 – 2018-04-02 (×3): 20 mg via ORAL
  Filled 2018-04-01 (×3): qty 1

## 2018-04-01 SURGICAL SUPPLY — 15 items

## 2018-04-01 NOTE — Anesthesia Preprocedure Evaluation (Signed)
Anesthesia Evaluation  Patient identified by MRN, date of birth, ID band Patient awake    Reviewed: Allergy & Precautions, NPO status , Patient's Chart, lab work & pertinent test results  Airway Mallampati: II  TM Distance: >3 FB     Dental   Pulmonary neg pulmonary ROS,    Pulmonary exam normal        Cardiovascular hypertension, Pt. on medications Normal cardiovascular exam     Neuro/Psych CVA    GI/Hepatic negative GI ROS, Neg liver ROS,   Endo/Other  negative endocrine ROS  Renal/GU Renal disease     Musculoskeletal   Abdominal   Peds  Hematology  (+) anemia ,   Anesthesia Other Findings   Reproductive/Obstetrics                             Lab Results  Component Value Date   WBC 13.9 (H) 04/01/2018   HGB 8.2 (L) 04/01/2018   HCT 28.3 (L) 04/01/2018   MCV 80.6 04/01/2018   PLT 279 04/01/2018   Lab Results  Component Value Date   CREATININE 1.30 (H) 04/01/2018   BUN 20 04/01/2018   NA 143 04/01/2018   K 4.0 04/01/2018   CL 114 (H) 04/01/2018   CO2 21 (L) 04/01/2018    Anesthesia Physical Anesthesia Plan  ASA: III  Anesthesia Plan: MAC   Post-op Pain Management:    Induction: Intravenous  PONV Risk Score and Plan: 1 and Propofol infusion, Ondansetron and Treatment may vary due to age or medical condition  Airway Management Planned: Natural Airway and Nasal Cannula  Additional Equipment:   Intra-op Plan:   Post-operative Plan:   Informed Consent: I have reviewed the patients History and Physical, chart, labs and discussed the procedure including the risks, benefits and alternatives for the proposed anesthesia with the patient or authorized representative who has indicated his/her understanding and acceptance.     Plan Discussed with: CRNA  Anesthesia Plan Comments:         Anesthesia Quick Evaluation

## 2018-04-01 NOTE — Progress Notes (Signed)
Shane Weiss 10:21 AM  Subjective: Patient without any new complaints and not much bowel movement no signs of bleeding  Objective: Vital signs stable afebrile no acute distress and please see preassessment evaluationBUN and creatinine improvedCBC stable  Assessment: Guaiac positive anemia long-standing iron deficiency in a patient on aspirin and currently with C. difficile  Plan: I had a long talk with the patient and wife and daughter about the procedure including the risks benefits and methods and will proceed with anesthesia assistance with further workup and plans pending those findings  [facility],Karlita Lichtman E  Pager [phone] After 5PM or if no answer call [phone]

## 2018-04-01 NOTE — Transfer of Care (Signed)
Immediate Anesthesia Transfer of Care Note  Patient: Shane Weiss  Procedure(s) Performed: ESOPHAGOGASTRODUODENOSCOPY (EGD) WITH PROPOFOL (N/A )  Patient Location: PACU and Endoscopy Unit  Anesthesia Type:MAC  Level of Consciousness: awake, alert  and oriented  Airway & Oxygen Therapy: Patient Spontanous Breathing and Patient connected to nasal cannula oxygen  Post-op Assessment: Report given to RN, Post -op Vital signs reviewed and stable and Patient moving all extremities X 4  Post vital signs: Reviewed and stable  Last Vitals:  Vitals Value Taken Time  BP    Temp    Pulse    Resp    SpO2      Last Pain:  Vitals:   04/01/18 1006  TempSrc: Oral  PainSc: 0-No pain      Patients Stated Pain Goal: 0 (54/98/26 4158)  Complications: No apparent anesthesia complications

## 2018-04-01 NOTE — Progress Notes (Addendum)
 @IPLOG @        PROGRESS NOTE                                                                                                                                                                                                             Patient Demographics:    Shane Weiss, is a 84 y.o. male, DOB - 08/12/1925, WUJ:[id number]  Admit date - 03/30/2018   Admitting Physician Joseph ArtJessica U Vann, DO  Outpatient Primary MD for the patient is [name], William, MD  LOS - 2  Chief Complaint  Patient presents with  . Loss of Consciousness       Brief Narrative  Shane BishopLawrence Riceis a 84 y.o.malewith medical history significant ofCVA/HTN. He lives at home with his wife and was in his usual health until May per the wife when he began to fall, have decreased energy, and weakness. Today he had 2 reported syncopal episodes and reported seizure like activity. Both syncopal episodes were with position changes. He was found to be hypotensive and was given 500 cc by EMS. Family reports he has been c/o visual troubles. In the ER, he was found to be incontinent of stools and covered in feces   Subjective:    Shane CoopLawrence Coey today has, No headache, No chest pain, No abdominal pain - No Nausea, No new weakness tingling or numbness, No Cough - SOB.     Assessment  & Plan :     1.  Syncope.  Due to combination of dehydration and anemia.  Much improved after IV fluids and transfusion.  Faylene MillionVance activity, PT.  2. Severe C. difficile diarrhea induced dehydration, hypotension - clinically improving on oral vancomycin, has been hydrated continue to monitor.  Initiate PT.  3.  ARF on CKD 4.  Due to above.  Almost completely resolved after 48 hrs of IVF.  Baseline creatinine around 1.5.  4. Acute on Chronic iron deficiency anemia.  EGD noted, nonacute, continue H2 blocker for now, once C. difficile resolves may cautiously placed on PPI but will defer this to GI, he is status post 2 units of packed RBC transfusion upon  admission and now stable H&H, oral iron supplementation started.  He was Hemoccult positive may require colonoscopy in the outpatient setting however C. difficile could also explain his Hemoccult blood loss.  5.  Deviated 2003.  Currently aspirin on hold due to anemia, will resume upon discharge with caution.    Diet :  Diet Order           DIET  SOFT Room service appropriate? Yes; Fluid consistency: Thin  Diet effective now           Family Communication  : Wife and daughter  Code Status : Full  Disposition Plan  : HH PT tomorrow  Consults  : GI  Procedures  :    CT head.  Nonacute   EGD.  Small hiatal hernia,- Moderately severe reflux esophagitis. - Atrophic gastritis. - A single gastric polyp. - Normal duodenal bulb, first portion of the duodenum and second portion of the duodenum. - The examination was otherwise normal. - No specimens collected.  DVT Prophylaxis  : SCDs    Lab Results  Component Value Date   PLT 279 04/01/2018    Inpatient Medications  Scheduled Meds: . dorzolamide-timolol  1 drop Both Eyes QHS  . famotidine  20 mg Oral BID  . feeding supplement (ENSURE ENLIVE)  237 mL Oral BID BM  . ferrous sulfate  325 mg Oral Q breakfast  . latanoprost  1 drop Both Eyes QHS  . vancomycin  125 mg Oral QID   Continuous Infusions: . sodium chloride 50 mL/hr at 04/01/18 0810   PRN Meds:.acetaminophen **OR** acetaminophen, ondansetron **OR** ondansetron (ZOFRAN) IV  Antibiotics  :    Anti-infectives (From admission, onward)   Start     Dose/Rate Route Frequency Ordered Stop   05/07/18 1000  vancomycin (VANCOCIN) 50 mg/mL oral solution 125 mg  Status:  Discontinued     125 mg Oral Every 3 DAYS 03/31/18 1223 03/31/18 1232   04/29/18 1000  vancomycin (VANCOCIN) 50 mg/mL oral solution 125 mg  Status:  Discontinued     125 mg Oral Every other day 03/31/18 1223 03/31/18 1232   04/22/18 1000  vancomycin (VANCOCIN) 50 mg/mL oral solution 125 mg  Status:   Discontinued     125 mg Oral Daily 03/31/18 1223 03/31/18 1232   04/14/18 2200  vancomycin (VANCOCIN) 50 mg/mL oral solution 125 mg  Status:  Discontinued     125 mg Oral 2 times daily 03/31/18 1223 03/31/18 1232   03/31/18 1400  vancomycin (VANCOCIN) 50 mg/mL oral solution 125 mg     125 mg Oral 4 times daily 03/31/18 1223 04/14/18 1359   03/31/18 1400  vancomycin (VANCOCIN) 50 mg/mL oral solution 125 mg  Status:  Discontinued     125 mg Oral 4 times daily 03/31/18 1223 03/31/18 1232         Objective:   Vitals:   04/01/18 1041 04/01/18 1050 04/01/18 1110 04/01/18 1115  BP: (!) 127/42 137/64 130/62 (!) 142/73  Pulse: 73 87 92 94  Resp: (!) 23 (!) 24 17 16   Temp: 98.7 F (37.1 C)     TempSrc: Oral     SpO2: 98% 97% 97% 97%  Weight:      Height:        Wt Readings from Last 3 Encounters:  04/01/18 56.7 kg (125 lb)  09/08/17 59 kg (130 lb)     Intake/Output Summary (Last 24 hours) at 04/01/2018 1300 Last data filed at 04/01/2018 1033 Gross per 24 hour  Intake 1192.03 ml  Output 550 ml  Net 642.03 ml     Physical Exam  Awake Alert, Oriented X 3, No new F.N deficits, Normal affect House.AT,PERRAL Supple Neck,No JVD, No cervical lymphadenopathy appriciated.  Symmetrical Chest wall movement, Good air movement bilaterally, CTAB RRR,No Gallops,Rubs or new Murmurs, No Parasternal Heave +ve B.Sounds, Abd Soft, No tenderness, No organomegaly appriciated, No rebound -  guarding or rigidity. No Cyanosis, Clubbing or edema, No new Rash or bruise       Data Review:    CBC Recent Labs  Lab 03/30/18 1401 03/30/18 1434 03/31/18 0506 04/01/18 0501  WBC  --  14.5* 16.6* 13.9*  HGB 6.0* 5.6* 8.8* 8.2*  HCT 23.5* 21.9* 30.2* 28.3*  PLT  --  281 297 279  MCV  --  80.2 80.1 80.6  MCH  --  20.5* 23.3* 23.4*  MCHC  --  25.6* 29.1* 29.0*  RDW  --  19.3* 18.2* 19.1*  LYMPHSABS  --  0.6*  --   --   MONOABS  --  0.6  --   --   EOSABS  --  0.0  --   --   BASOSABS  --  0.0  --    --     Chemistries  Recent Labs  Lab 03/30/18 1434 03/31/18 0506 04/01/18 0501  NA 142 141 143  K 4.7 4.2 4.0  CL 113* 112* 114*  CO2 18* 22 21*  GLUCOSE 154* 120* 107*  BUN 22 23 20   CREATININE 1.84* 1.59* 1.30*  CALCIUM 8.0* 7.8* 7.7*   ------------------------------------------------------------------------------------------------------------------ No results for input(s): CHOL, HDL, LDLCALC, TRIG, CHOLHDL, LDLDIRECT in the last 72 hours.  No results found for: HGBA1C ------------------------------------------------------------------------------------------------------------------ No results for input(s): TSH, T4TOTAL, T3FREE, THYROIDAB in the last 72 hours.  Invalid input(s): FREET3 ------------------------------------------------------------------------------------------------------------------ Recent Labs    03/30/18 1401 03/30/18 1558  VITAMINB12  --  222  FOLATE  --  14.8  FERRITIN  --  8*  TIBC  --  231*  IRON  --  16*  RETICCTPCT 1.5  --     Coagulation profile Recent Labs  Lab 03/31/18 0506  INR 1.31    No results for input(s): DDIMER in the last 72 hours.  Cardiac Enzymes No results for input(s): CKMB, TROPONINI, MYOGLOBIN in the last 168 hours.  Invalid input(s): CK ------------------------------------------------------------------------------------------------------------------ No results found for: BNP  Micro Results Recent Results (from the past 240 hour(s))  MRSA PCR Screening     Status: None   Collection Time: 03/30/18  8:20 PM  Result Value Ref Range Status   MRSA by PCR NEGATIVE NEGATIVE Final    Comment:        The GeneXpert MRSA Assay (FDA approved for NASAL specimens only), is one component of a comprehensive MRSA colonization surveillance program. It is not intended to diagnose MRSA infection nor to guide or monitor treatment for MRSA infections. Performed at [facility], [address], [place], [place]  [address]   C difficile quick scan w PCR reflex     Status: Abnormal   Collection Time: 03/31/18 10:40 AM  Result Value Ref Range Status   C Diff antigen POSITIVE (A) NEGATIVE Final   C Diff toxin NEGATIVE NEGATIVE Final   C Diff interpretation Results are indeterminate. See PCR results.  Final  C. Diff by PCR, Reflexed     Status: Abnormal   Collection Time: 03/31/18 10:40 AM  Result Value Ref Range Status   Toxigenic C. Difficile by PCR POSITIVE (A) NEGATIVE Final    Comment: Positive for toxigenic C. difficile with little to no toxin production. Only treat if clinical presentation suggests symptomatic illness. Performed at [facility], [address], [place], [place] [address]     Radiology Reports Ct Head Wo Contrast  Result Date: 03/30/2018 CLINICAL DATA:  Syncope, seizure-like activity EXAM: CT HEAD WITHOUT CONTRAST  TECHNIQUE: Contiguous axial images were obtained from the base of the skull through the vertex without intravenous contrast. COMPARISON:  None. FINDINGS: Brain: No evidence of acute infarction, hemorrhage, hydrocephalus, extra-axial collection or mass lesion/mass effect. Encephalomalacic changes related to prior right frontal infarct. Subcortical white matter and periventricular small vessel ischemic changes. Vascular: Mild intracranial atherosclerosis. Skull: Normal. Negative for fracture or focal lesion. Sinuses/Orbits: Mild partial opacification of the bilateral ethmoid sinuses. Mastoid air cells are clear. Other: None. IMPRESSION: No evidence of acute intracranial abnormality. Small vessel ischemic changes. Encephalomalacic changes related to prior right frontal infarct. Electronically Signed   By: Charline Bills M.D.   On: 03/30/2018 15:38    Time Spent in minutes  30   Susa Raring M.D on 04/01/2018 at 1:00 PM  Between 7am to 7pm - Pager - [phone] ( page via [email or web address], text pages only, please mention full 10 digit call back number). After 7pm go to  [email or web address] - password [facility]

## 2018-04-01 NOTE — Op Note (Signed)
[facility] Patient Name: Per Beagley Procedure Date : 04/01/2018 MRN: [id number] Attending MD: Vida Rigger , MD Date of Birth: [birth year] CSN: [id number] Age: 84 Admit Type: Inpatient Procedure:                Upper GI endoscopy Indications:              Iron deficiency anemia, Heme positive stool Providers:                Vida Rigger, MD, Autumn Titus Dubin RN, RN, Beryle Beams, Technician, Carmela Rima CRNA, CRNA Referring MD:              Medicines:                Propofol total dose 50 mg IV,30 mg IV lidocaine Complications:            No immediate complications. Estimated Blood Loss:     Estimated blood loss: none. Procedure:                Pre-Anesthesia Assessment:                           - Prior to the procedure, a History and Physical                            was performed, and patient medications and                            allergies were reviewed. The patient's tolerance of                            previous anesthesia was also reviewed. The risks                            and benefits of the procedure and the sedation                            options and risks were discussed with the patient.                            All questions were answered, and informed consent                            was obtained. Prior Anticoagulants: The patient has                            taken aspirin, last dose was 1 day prior to                            procedure. ASA Grade Assessment: III - A patient                            with severe systemic disease. After reviewing the  risks and benefits, the patient was deemed in                            satisfactory condition to undergo the procedure.                           After obtaining informed consent, the endoscope was                            passed under direct vision. Throughout the                            procedure, the patient's blood  pressure, pulse, and                            oxygen saturations were monitored continuously. The                            EG-[id number] ([id number]) scope was introduced through the                            mouth, and advanced to the second part of duodenum.                            The upper GI endoscopy was accomplished without                            difficulty. The patient tolerated the procedure                            well. Scope In: Scope Out: Findings:      A small hiatal hernia was present.      Moderately severe esophagitis with no bleeding was found.      Diffuse mild inflammation characterized by congestion (edema) was found       in the entire examined stomach.      A single diminutive semi-sessile polyp was found on the greater       curvature of the stomach.      The duodenal bulb, first portion of the duodenum and second portion of       the duodenum were normal.      The exam was otherwise without abnormality. Impression:               - Small hiatal hernia.                           - Moderately severe reflux esophagitis.                           - Atrophic gastritis.                           - A single gastric polyp.                           - Normal duodenal bulb, first portion of  the                            duodenum and second portion of the duodenum.                           - The examination was otherwise normal.                           - No specimens collected. Recommendation:           - Patient has a contact number available for                            emergencies. The signs and symptoms of potential                            delayed complications were discussed with the                            patient. Return to normal activities tomorrow.                            Written discharge instructions were provided to the                            patient.                           - Soft diet today. may resume previous regular diet                             tomorrow                           - Continue present medications. we'll use H2                            blockers twice a day until C. difficile better then                            would recommend changing to once a day pump                            inhibitors                           - Return to GI clinic in 4 weeks. Will recheck CBC                            and guaiac's as well as repeat CT once C. difficile                            improves to make sure no further GI workup is needed                           -  Telephone GI clinic if symptomatic PRN. Procedure Code(s):        --- Professional ---                           [phone], Esophagogastroduodenoscopy, flexible,                            transoral; diagnostic, including collection of                            specimen(s) by brushing or washing, when performed                            (separate procedure) Diagnosis Code(s):        --- Professional ---                           K44.9, Diaphragmatic hernia without obstruction or                            gangrene                           K21.0, Gastro-esophageal reflux disease with                            esophagitis                           K29.40, Chronic atrophic gastritis without bleeding                           K31.7, Polyp of stomach and duodenum                           D50.9, Iron deficiency anemia, unspecified                           R19.5, Other fecal abnormalities CPT copyright 2017 [employer]. All rights reserved. The codes documented in this report are preliminary and upon coder review may  be revised to meet current compliance requirements. Vida Rigger, MD 04/01/2018 10:48:41 AM This report has been signed electronically. Number of Addenda: 0

## 2018-04-01 NOTE — Anesthesia Procedure Notes (Signed)
Procedure Name: MAC Date/Time: 04/01/2018 10:26 AM Performed by: Neldon Newport, CRNA Pre-anesthesia Checklist: Suction available, Timeout performed, Patient being monitored, Emergency Drugs available and Patient identified Oxygen Delivery Method: Nasal cannula

## 2018-04-01 NOTE — Progress Notes (Signed)
Patient a 84 y.o scheduled for EGD this AM. Patient had been NPO through the night.  Daughter verbalized concern about sedation during the procedure. Daughter said she is worried about the sedation and would like to have some talk with Dr. Ewing SchleinMagod before her father goes for the procedure. Her phone # is [phone]

## 2018-04-01 NOTE — Progress Notes (Signed)
A long talk was had with the patient and wife and daughter after the procedure and please see that note for details per my plan is to continue iron and H2 blockers until C. Difficile better then switch him to pump inhibitors and see back in the office in one month to repeat CBC guaiacs and CT scan once colitis is better just to make sure no residual abnormality in the colon and they know to call me sooner when necessary otherwise please call my partner this weekend if any other question or problems

## 2018-04-02 DIAGNOSIS — A0472 Enterocolitis due to Clostridium difficile, not specified as recurrent: Principal | ICD-10-CM

## 2018-04-02 DIAGNOSIS — N184 Chronic kidney disease, stage 4 (severe): Secondary | ICD-10-CM

## 2018-04-02 DIAGNOSIS — I10 Essential (primary) hypertension: Secondary | ICD-10-CM

## 2018-04-02 MED ORDER — ASPIRIN 81 MG PO TABS: 81.0000 mg | ORAL_TABLET | Freq: Every day | ORAL | 0 refills | Status: AC

## 2018-04-02 MED ORDER — FAMOTIDINE 20 MG PO TABS: 20.0000 mg | ORAL_TABLET | Freq: Two times a day (BID) | ORAL | 2 refills | Status: AC

## 2018-04-02 MED ORDER — VANCOMYCIN 50 MG/ML ORAL SOLUTION: ORAL | 1 refills | Status: DC

## 2018-04-02 MED ORDER — FERROUS SULFATE 325 (65 FE) MG PO TABS: 325.0000 mg | ORAL_TABLET | Freq: Two times a day (BID) | ORAL | 0 refills | Status: DC

## 2018-04-02 NOTE — Discharge Instructions (Signed)
Follow with Primary MD [name], William, MD in 7 days   Get CBC, CMP, 2 view Chest X ray checked  by Primary MD or SNF MD in 5-7 days    Activity: As tolerated with Full fall precautions use walker/cane & assistance as needed  Disposition Home   Diet:  Heart Healthy    For Heart failure patients - Check your Weight same time everyday, if you gain over 2 pounds, or you develop in leg swelling, experience more shortness of breath or chest pain, call your Primary MD immediately. Follow Cardiac Low Salt Diet and 1.5 lit/day fluid restriction.  Special Instructions: If you have smoked or chewed Tobacco  in the last 2 yrs please stop smoking, stop any regular Alcohol  and or any Recreational drug use.  On your next visit with your primary care physician please Get Medicines reviewed and adjusted.  Please request your Prim.MD to go over all Hospital Tests and Procedure/Radiological results at the follow up, please get all Hospital records sent to your Prim MD by signing hospital release before you go home.  If you experience worsening of your admission symptoms, develop shortness of breath, life threatening emergency, suicidal or homicidal thoughts you must seek medical attention immediately by calling 911 or calling your MD immediately  if symptoms less severe.  You Must read complete instructions/literature along with all the possible adverse reactions/side effects for all the Medicines you take and that have been prescribed to you. Take any new Medicines after you have completely understood and accpet all the possible adverse reactions/side effects.

## 2018-04-02 NOTE — Evaluation (Signed)
Physical Therapy Evaluation Patient Details Name: Jason CoopLawrence Shull MRN: [id number] DOB: 11/03/1924 Today's Date: 04/02/2018   History of Present Illness  Pt is a 84 y.o. M with significant PMH of CVA/HTN. Starting in May, patient wife reports he began to fall, have decreased energy and weakness. Presenting with 2 syncopal episodes and seizure like activity. In the ER, was found to be incontinent of stools and covered in feces.  Clinical Impression  Patient is independent at baseline but reports history of falling, with three falls just in the past month. Patient demonstrating increased steadiness with use of walker and recommended walker use at home for all mobility. Ambulated 30 feet in room with walker and mostly min guard assist. Does demonstrate some impulsivity and needs frequent cueing for safety awareness with lines/leads. Will follow acutely.    Follow Up Recommendations Home health PT;Supervision for mobility/OOB    Equipment Recommendations  Rolling walker with 5" wheels    Recommendations for Other Services       Precautions / Restrictions Precautions Precautions: Fall Restrictions Weight Bearing Restrictions: No      Mobility  Bed Mobility Overal bed mobility: Needs Assistance Bed Mobility: Supine to Sit     Supine to sit: Min assist     General bed mobility comments: Min assist to pull up to sit  Transfers Overall transfer level: Needs assistance Equipment used: Rolling walker (2 wheeled) Transfers: Sit to/from Stand Sit to Stand: Min assist         General transfer comment: ligh min assist to help stand from lowered surface (toilet)  Ambulation/Gait Ambulation/Gait assistance: Min guard;Min assist Gait Distance (Feet): 30 Feet Assistive device: Rolling walker (2 wheeled);None Gait Pattern/deviations: Step-through pattern;Narrow base of support;Decreased stride length     General Gait Details: Initially ambulated in room without walker but patient with  mild loss of balance when turning. Demonstrates increased steadiness with walker. Max directional and sequential cueing needed for line management due to patient impulsivity  Stairs            Wheelchair Mobility    Modified Rankin (Stroke Patients Only)       Balance Overall balance assessment: Mild deficits observed, not formally tested                                           Pertinent Vitals/Pain Pain Assessment: No/denies pain    Home Living Family/patient expects to be discharged to:: Private residence Living Arrangements: Spouse/significant other Available Help at Discharge: Family;Available 24 hours/day Type of Home: House Home Access: Stairs to enter Entrance Stairs-Rails: Can reach both Entrance Stairs-Number of Steps: 2 Home Layout: One level Home Equipment: None      Prior Function Level of Independence: Independent         Comments: Reports 3 falls in the past month     Hand Dominance        Extremity/Trunk Assessment   Upper Extremity Assessment Upper Extremity Assessment: Overall WFL for tasks assessed    Lower Extremity Assessment Lower Extremity Assessment: Generalized weakness    Cervical / Trunk Assessment Cervical / Trunk Assessment: Kyphotic  Communication   Communication: HOH  Cognition Arousal/Alertness: Awake/alert Behavior During Therapy: Impulsive Overall Cognitive Status: Within Functional Limits for tasks assessed  General Comments      Exercises     Assessment/Plan    PT Assessment Patient needs continued PT services  PT Problem List Decreased strength;Decreased activity tolerance;Decreased balance;Decreased mobility;Decreased coordination;Decreased safety awareness       PT Treatment Interventions DME instruction;Gait training;Functional mobility training;Stair training;Therapeutic activities;Therapeutic exercise;Balance  training;Patient/family education    PT Goals (Current goals can be found in the Care Plan section)  Acute Rehab PT Goals Patient Stated Goal: "walk with a walker" PT Goal Formulation: With patient Time For Goal Achievement: 04/16/18 Potential to Achieve Goals: Good    Frequency Min 3X/week   Barriers to discharge        Co-evaluation               AM-PAC PT "6 Clicks" Daily Activity  Outcome Measure Difficulty turning over in bed (including adjusting bedclothes, sheets and blankets)?: None Difficulty moving from lying on back to sitting on the side of the bed? : Unable Difficulty sitting down on and standing up from a chair with arms (e.g., wheelchair, bedside commode, etc,.)?: A Little Help needed moving to and from a bed to chair (including a wheelchair)?: A Little Help needed walking in hospital room?: A Little Help needed climbing 3-5 steps with a railing? : A Little 6 Click Score: 17    End of Session   Activity Tolerance: Patient tolerated treatment well Patient left: in chair;with call bell/phone within reach;with chair alarm set;with family/visitor present   PT Visit Diagnosis: Unsteadiness on feet (R26.81);Muscle weakness (generalized) (M62.81)    Time: 1610-9604 PT Time Calculation (min) (ACUTE ONLY): 23 min   Charges:   PT Evaluation $PT Eval Moderate Complexity: 1 Mod PT Treatments $Gait Training: 8-22 mins   PT G Codes:        Laurina Bustle, PT, DPT Acute Rehabilitation Services  Pager: [phone]   Vanetta Mulders 04/02/2018, 10:49 AM

## 2018-04-02 NOTE — Care Management Note (Addendum)
Case Management Note  Patient Details  Name: Shane Weiss MRN: [id number] Date of Birth: 12/04/1924  Subjective/Objective:    Anemia, Resides with wife, Shane Weiss.              Shane Weiss (Spouse) Shane Weiss (Daughter)     [phone]     PCP: Shane Weiss  Action/Plan: Transition to home with family with home health services to follow. Family to provide transportation to home.  Expected Discharge Date:  04/02/18               Expected Discharge Plan:  Home w Home Health Services  In-House Referral:     Discharge planning Services  CM Consult  Post Acute Care Choice:    Choice offered to:  Patient  DME Arranged:    rolling walker [facility]  HH Arranged:  RN, PT, Nurse's Aide HH Agency:  [facility]  Status of Service:  Completed, signed off  If discussed at Long Length of Stay Meetings, dates discussed:    Additional Comments:  Epifanio LeschesCole, Bohdan Macho Hudson, RN 04/02/2018, 9:54 AM

## 2018-04-02 NOTE — Discharge Summary (Signed)
Shane Weiss:[id number] DOB: 1925-01-02 DOA: 03/30/2018  PCP: Johny Blamer, MD  Admit date: 03/30/2018  Discharge date: 04/02/2018  Admitted From: Home   Disposition:  Home   Recommendations for Outpatient Follow-up:   Follow up with PCP in 1-2 weeks  PCP Please obtain BMP/CBC, 2 view CXR in 1week,  (see Discharge instructions)   PCP Please follow up on the following pending results:    Home Health: PT, RN, aide   Equipment/Devices: None  Consultations: GI Discharge Condition: Stable   CODE STATUS: Full   Diet Recommendation: Heart Healthy     Chief Complaint  Patient presents with  . Loss of Consciousness     Brief history of present illness from the day of admission and additional interim summary    Chick Riceis a 84 y.o.malewith medical history significant ofCVA/HTN. He lives at home with his wife and was in his usual health until May per the wife when he began to fall, have decreased energy, and weakness. Today he had 2 reported syncopal episodes and reported seizure like activity. Both syncopal episodes were with position changes. He was found to be hypotensive and was given 500 cc by EMS. Family reports he has been c/o visual troubles. In the ER, he was found to be incontinent of stools and covered in feces                                                                 Hospital Course    1. Syncope.  Due to combination of dehydration and anemia.  Much improved after IV fluids and transfusion.  Ambulated well in the hallway, family and patient adamant and being sent home today.  Says he feels back to his baseline.  Will be discharged home.  2. Severe C. difficile diarrhea induced dehydration, hypotension - clinically proved with oral vancomycin, diarrhea much improved, was hydrated  with IV fluids, renal function close to baseline, will be discharged with 2 more weeks of oral vancomycin with PCP and GI follow-up in the outpatient setting.  3.  ARF on CKD 4.  Due to above.  Almost completely resolved after 48 hrs of IVF.  Baseline creatinine around 1.5.  4.   Severe acute on Chronic iron deficiency anemia.    Hemoglobin was 5.6 upon admission requiring 2 units of packed RBC, GI was consulted he underwent EGD noted as below, nonacute, continue H2 blocker for now, once C. difficile resolves may cautiously placed on PPI but will defer this to GI, may require outpatient colonoscopy as well, will request him to follow with PCP and GI outpatient, will also place him on oral iron upon discharge.  5.  HX of CVA in 2003.  Currently aspirin on hold due to anemia, will resume in 1 week with caution, placed on  H2 blocker twice daily.    EGD.  Small hiatal hernia,- Moderately severe reflux esophagitis. - Atrophic gastritis. - A single gastric polyp. - Normal duodenal bulb, first portion of the duodenum and second portion of the duodenum. - The examination was otherwise normal. - No specimens collected.    Discharge diagnosis     Active Problems:   Symptomatic anemia   H/O: CVA (cerebrovascular accident)   HTN (hypertension)   CKD (chronic kidney disease), stage IV (HCC)   C. difficile diarrhea   Ambulatory dysfunction    Discharge instructions    Discharge Instructions    Diet - low sodium heart healthy   Complete by:  As directed    Discharge instructions   Complete by:  As directed    Follow with Primary MD Johny Blamer, MD in 7 days   Get CBC, CMP, 2 view Chest X ray checked  by Primary MD or SNF MD in 5-7 days    Activity: As tolerated with Full fall precautions use walker/cane & assistance as needed  Disposition Home   Diet:  Heart Healthy    For Heart failure patients - Check your Weight same time everyday, if you gain over 2 pounds, or you develop in  leg swelling, experience more shortness of breath or chest pain, call your Primary MD immediately. Follow Cardiac Low Salt Diet and 1.5 lit/day fluid restriction.  Special Instructions: If you have smoked or chewed Tobacco  in the last 2 yrs please stop smoking, stop any regular Alcohol  and or any Recreational drug use.  On your next visit with your primary care physician please Get Medicines reviewed and adjusted.  Please request your Prim.MD to go over all Hospital Tests and Procedure/Radiological results at the follow up, please get all Hospital records sent to your Prim MD by signing hospital release before you go home.  If you experience worsening of your admission symptoms, develop shortness of breath, life threatening emergency, suicidal or homicidal thoughts you must seek medical attention immediately by calling 911 or calling your MD immediately  if symptoms less severe.  You Must read complete instructions/literature along with all the possible adverse reactions/side effects for all the Medicines you take and that have been prescribed to you. Take any new Medicines after you have completely understood and accpet all the possible adverse reactions/side effects.   Increase activity slowly   Complete by:  As directed       Discharge Medications   Allergies as of 04/02/2018   No Known Allergies     Medication List    TAKE these medications   amLODipine 10 MG tablet Commonly known as:  NORVASC Take 10 mg by mouth daily.   aspirin 81 MG tablet Take 1 tablet (81 mg total) by mouth daily. Start taking on:  04/12/2018 What changed:  These instructions start on 04/12/2018. If you are unsure what to do until then, ask your doctor or other care provider.   benazepril 40 MG tablet Commonly known as:  LOTENSIN Take 40 mg by mouth daily.   dorzolamide-timolol 22.3-6.8 MG/ML ophthalmic solution Commonly known as:  COSOPT Place 1 drop into both eyes at bedtime.   doxazosin 8 MG  tablet Commonly known as:  CARDURA Take 8 mg by mouth at bedtime.   famotidine 20 MG tablet Commonly known as:  PEPCID Take 1 tablet (20 mg total) by mouth 2 (two) times daily.   ferrous sulfate 325 (65 FE) MG tablet Take 1 tablet (325  mg total) by mouth 2 (two) times daily with a meal.   latanoprost 0.005 % ophthalmic solution Commonly known as:  XALATAN Place 1 drop into both eyes at bedtime.   vancomycin 50 mg/mL oral solution Commonly known as:  VANCOCIN Dispense 2-week supply of 125 cc every 6 hours p.o.       Follow-up Information    Johny Blamer, MD Follow up in 1 week(s).   Specialty:  Family Medicine Contact information: [phone] [address] [place] [address] [phone]        Carman Ching, MD. Schedule an appointment as soon as possible for a visit in 1 week(s).   Specialty:  Gastroenterology Contact information: [address] [place] [address] [phone]           Major procedures and Radiology Reports - PLEASE review detailed and final reports thoroughly  -        Ct Head Wo Contrast  Result Date: 03/30/2018 CLINICAL DATA:  Syncope, seizure-like activity EXAM: CT HEAD WITHOUT CONTRAST TECHNIQUE: Contiguous axial images were obtained from the base of the skull through the vertex without intravenous contrast. COMPARISON:  None. FINDINGS: Brain: No evidence of acute infarction, hemorrhage, hydrocephalus, extra-axial collection or mass lesion/mass effect. Encephalomalacic changes related to prior right frontal infarct. Subcortical white matter and periventricular small vessel ischemic changes. Vascular: Mild intracranial atherosclerosis. Skull: Normal. Negative for fracture or focal lesion. Sinuses/Orbits: Mild partial opacification of the bilateral ethmoid sinuses. Mastoid air cells are clear. Other: None. IMPRESSION: No evidence of acute intracranial abnormality. Small vessel ischemic changes. Encephalomalacic  changes related to prior right frontal infarct. Electronically Signed   By: Charline Bills M.D.   On: 03/30/2018 15:38    Micro Results     Recent Results (from the past 240 hour(s))  MRSA PCR Screening     Status: None   Collection Time: 03/30/18  8:20 PM  Result Value Ref Range Status   MRSA by PCR NEGATIVE NEGATIVE Final    Comment:        The GeneXpert MRSA Assay (FDA approved for NASAL specimens only), is one component of a comprehensive MRSA colonization surveillance program. It is not intended to diagnose MRSA infection nor to guide or monitor treatment for MRSA infections. Performed at [facility], [address] [place], [place] [address]   C difficile quick scan w PCR reflex     Status: Abnormal   Collection Time: 03/31/18 10:40 AM  Result Value Ref Range Status   C Diff antigen POSITIVE (A) NEGATIVE Final   C Diff toxin NEGATIVE NEGATIVE Final   C Diff interpretation Results are indeterminate. See PCR results.  Final  C. Diff by PCR, Reflexed     Status: Abnormal   Collection Time: 03/31/18 10:40 AM  Result Value Ref Range Status   Toxigenic C. Difficile by PCR POSITIVE (A) NEGATIVE Final    Comment: Positive for toxigenic C. difficile with little to no toxin production. Only treat if clinical presentation suggests symptomatic illness. Performed at [facility], [address] [place], [place] [address]     Today   Subjective    Daril Warga today has no headache,no chest abdominal pain,no new weakness tingling or numbness, feels much better wants to go home today.     Objective   Blood pressure 119/88, pulse 67, temperature (!) 97.2 F (36.2 C), temperature source Oral, resp. rate 14, height 5\' 6"  (1.676 m), weight 56.7 kg (  125 lb), SpO2 92 %.   Intake/Output Summary (Last 24 hours) at 04/02/2018 0929 Last data filed at 04/01/2018 1942 Gross per 24 hour  Intake 490 ml  Output 50 ml  Net 440 ml    Exam Awake Alert, Oriented x 3,  No new F.N deficits, Normal affect [place].AT,PERRAL Supple Neck,No JVD, No cervical lymphadenopathy appriciated.  Symmetrical Chest wall movement, Good air movement bilaterally, CTAB RRR,No Gallops,Rubs or new Murmurs, No Parasternal Heave +ve B.Sounds, Abd Soft, Non tender, No organomegaly appriciated, No rebound -guarding or rigidity. No Cyanosis, Clubbing or edema, No new Rash or bruise   Data Review   CBC w Diff:  Lab Results  Component Value Date   WBC 13.9 (H) 04/01/2018   HGB 8.2 (L) 04/01/2018   HCT 28.3 (L) 04/01/2018   PLT 279 04/01/2018   LYMPHOPCT 4 03/30/2018   MONOPCT 4 03/30/2018   EOSPCT 0 03/30/2018   BASOPCT 0 03/30/2018    CMP:  Lab Results  Component Value Date   NA 143 04/01/2018   K 4.0 04/01/2018   CL 114 (H) 04/01/2018   CO2 21 (L) 04/01/2018   BUN 20 04/01/2018   CREATININE 1.30 (H) 04/01/2018   PROT 6.2 (L) 09/08/2017   ALBUMIN 3.1 (L) 09/08/2017   BILITOT 1.6 (H) 09/08/2017   ALKPHOS 82 09/08/2017   AST 25 09/08/2017   ALT 11 (L) 09/08/2017  .   Total Time in preparing paper work, data evaluation and todays exam - 35 minutes  Susa RaringPrashant Luci Bellucci M.D on 04/02/2018 at 9:29 AM  [facility]   Office  [phone]

## 2018-04-04 ENCOUNTER — Encounter (HOSPITAL_COMMUNITY): Payer: Self-pay | Admitting: Gastroenterology

## 2018-04-11 NOTE — Anesthesia Postprocedure Evaluation (Signed)
Anesthesia Post Note  Patient: Shane Weiss  Procedure(s) Performed: ESOPHAGOGASTRODUODENOSCOPY (EGD) WITH PROPOFOL (N/A )     Patient location during evaluation: PACU Anesthesia Type: MAC Level of consciousness: awake and alert Pain management: pain level controlled Vital Signs Assessment: post-procedure vital signs reviewed and stable Respiratory status: spontaneous breathing, nonlabored ventilation, respiratory function stable and patient connected to nasal cannula oxygen Cardiovascular status: stable and blood pressure returned to baseline Postop Assessment: no apparent nausea or vomiting Anesthetic complications: no    Last Vitals:  Vitals:   04/02/18 0557 04/02/18 0709  BP:  119/88  Pulse:  67  Resp:  14  Temp: 36.9 C (!) 36.2 C  SpO2:  92%    Last Pain:  Vitals:   04/02/18 0920  TempSrc:   PainSc: 0-No pain   Pain Goal: Patients Stated Pain Goal: 0 (03/30/18 2016)               Tiajuana Amass

## 2018-05-11 ENCOUNTER — Other Ambulatory Visit: Payer: Self-pay | Admitting: Gastroenterology

## 2018-05-11 DIAGNOSIS — D508 Other iron deficiency anemias: Secondary | ICD-10-CM

## 2018-05-11 DIAGNOSIS — R634 Abnormal weight loss: Secondary | ICD-10-CM

## 2018-05-11 DIAGNOSIS — R933 Abnormal findings on diagnostic imaging of other parts of digestive tract: Secondary | ICD-10-CM

## 2018-05-20 ENCOUNTER — Emergency Department (HOSPITAL_COMMUNITY)
Admission: EM | Admit: 2018-05-20 | Discharge: 2018-05-20 | Disposition: A | Discharge status: Home / Self Care | Payer: Medicare Other | Attending: Emergency Medicine | Admitting: Emergency Medicine

## 2018-05-20 ENCOUNTER — Other Ambulatory Visit: Payer: Self-pay

## 2018-05-20 ENCOUNTER — Emergency Department (HOSPITAL_COMMUNITY): Payer: Medicare Other

## 2018-05-20 ENCOUNTER — Other Ambulatory Visit: Payer: Medicare Other

## 2018-05-20 ENCOUNTER — Encounter (HOSPITAL_COMMUNITY): Payer: Self-pay | Admitting: Emergency Medicine

## 2018-05-20 DIAGNOSIS — Z79899 Other long term (current) drug therapy: Secondary | ICD-10-CM | POA: Diagnosis not present

## 2018-05-20 DIAGNOSIS — S42351A Displaced comminuted fracture of shaft of humerus, right arm, initial encounter for closed fracture: Secondary | ICD-10-CM | POA: Diagnosis not present

## 2018-05-20 DIAGNOSIS — Z7982 Long term (current) use of aspirin: Secondary | ICD-10-CM | POA: Diagnosis not present

## 2018-05-20 DIAGNOSIS — Y999 Unspecified external cause status: Secondary | ICD-10-CM | POA: Insufficient documentation

## 2018-05-20 DIAGNOSIS — I10 Essential (primary) hypertension: Secondary | ICD-10-CM | POA: Insufficient documentation

## 2018-05-20 DIAGNOSIS — Y929 Unspecified place or not applicable: Secondary | ICD-10-CM | POA: Insufficient documentation

## 2018-05-20 DIAGNOSIS — W010XXA Fall on same level from slipping, tripping and stumbling without subsequent striking against object, initial encounter: Secondary | ICD-10-CM | POA: Diagnosis not present

## 2018-05-20 DIAGNOSIS — Y9301 Activity, walking, marching and hiking: Secondary | ICD-10-CM | POA: Insufficient documentation

## 2018-05-20 DIAGNOSIS — S4991XA Unspecified injury of right shoulder and upper arm, initial encounter: Secondary | ICD-10-CM | POA: Diagnosis present

## 2018-05-20 MED ORDER — HYDROCODONE-ACETAMINOPHEN 5-325 MG PO TABS: 1.0000 | ORAL_TABLET | Freq: Four times a day (QID) | ORAL | 0 refills | Status: DC | PRN

## 2018-05-20 MED ORDER — HYDROCODONE-ACETAMINOPHEN 5-325 MG PO TABS
1.0000 | ORAL_TABLET | Freq: Once | ORAL | Status: AC
  Administered 2018-05-20: 1 via ORAL
  Filled 2018-05-20: qty 1

## 2018-05-20 MED ORDER — HYDROCODONE-ACETAMINOPHEN 5-325 MG PO TABS: 1.0000 | ORAL_TABLET | Freq: Four times a day (QID) | ORAL | 0 refills | Status: AC | PRN

## 2018-05-20 MED ORDER — FENTANYL CITRATE (PF) 100 MCG/2ML IJ SOLN
50.0000 ug | Freq: Once | INTRAMUSCULAR | Status: AC
  Administered 2018-05-20: 50 ug via INTRAVENOUS
  Filled 2018-05-20: qty 2

## 2018-05-20 NOTE — Progress Notes (Signed)
Orthopedic Tech Progress Note Patient Details:  Shane Weiss [birth year] [id number]  Ortho Devices Type of Ortho Device: Coapt, Sling immobilizer Ortho Device/Splint Location: rue Ortho Device/Splint Interventions: Application   Post Interventions Patient Tolerated: Well Instructions Provided: Care of device Asa ordered by PA Dorathy Daft, Rishab Stoudt 05/20/2018, 9:50 AM

## 2018-05-20 NOTE — Consult Note (Signed)
Reason for Consult:Right humerus fx Referring Physician: P Cardama  Shane Weiss is an 84 y.o. male.  HPI: Leigh was at home and tripped and fell, landing on his right side. He had immediate arm pain. He came to the ED where x-rays showed a right humerus fx and orthopedic surgery was consulted. He denies syncope or presyncope. He is RHD.  Past Medical History:  Diagnosis Date  . History of Bell's palsy    /notes 01/28/2011  . History of blood transfusion 03/30/2018   "low blood"   . Hypertension   . Stroke [facility]) 2002   denies deficits on 03/30/2018    Past Surgical History:  Procedure Laterality Date  . CAROTID ENDARTERECTOMY Right 10/2001   with Dacron patch angioplasty./notes 01/28/2011  . CATARACT EXTRACTION, BILATERAL Bilateral   . ESOPHAGOGASTRODUODENOSCOPY (EGD) WITH PROPOFOL N/A 04/01/2018   Procedure: ESOPHAGOGASTRODUODENOSCOPY (EGD) WITH PROPOFOL;  Surgeon: Vida Rigger, MD;  Location: [facility] ENDOSCOPY;  Service: Endoscopy;  Laterality: N/A;  . MASS EXCISION     "Bible bump on my scalp; had it removed"    No family history on file.  Social History:  reports that he has never smoked. He has never used smokeless tobacco. He reports that he does not drink alcohol or use drugs.  Allergies: No Known Allergies  Medications: I have reviewed the patient's current medications.  No results found for this or any previous visit (from the past 48 hour(s)).  Dg Elbow Complete Right  Result Date: 05/20/2018 CLINICAL DATA:  Pain following fall EXAM: RIGHT ELBOW - COMPLETE 3+ VIEW COMPARISON:  None. FINDINGS: Frontal, oblique, and attempted lateral views obtained. No appreciable elbow joint region fracture or dislocation. No joint effusion. There is a spur arising from the olecranon process of the proximal ulna. No appreciable joint space narrowing or erosion. IMPRESSION: No evident fracture or dislocation. Spurring along the olecranon process of the proximal ulna. No appreciable joint  space narrowing. Electronically Signed   By: Bretta Bang III M.D.   On: 05/20/2018 07:36   Dg Humerus Right  Result Date: 05/20/2018 CLINICAL DATA:  Pain following fall EXAM: RIGHT HUMERUS - 2+ VIEW COMPARISON:  None. FINDINGS: Frontal and lateral views were obtained. There is a comminuted fracture of the humerus located apart semi 14 cm distal to the humeral head. There is mild lateral and anterior displacement of the distal major fracture fragment with respect to the major proximal fragment. No other fractures are evident. No dislocation. There is calcification along the lateral right humeral head, likely due to calcific tendinosis. There is a spur along the olecranon process of the proximal ulna. IMPRESSION: Comminuted fracture proximal right humerus with mild displacement of major fracture fragments. No dislocation. Calcific tendinosis lateral right shoulder region. Electronically Signed   By: Bretta Bang III M.D.   On: 05/20/2018 07:33    Review of Systems  Constitutional: Negative for weight loss.  HENT: Negative for ear discharge, ear pain, hearing loss and tinnitus.   Eyes: Negative for blurred vision, double vision, photophobia and pain.  Respiratory: Negative for cough, sputum production and shortness of breath.   Cardiovascular: Negative for chest pain.  Gastrointestinal: Negative for abdominal pain, nausea and vomiting.  Genitourinary: Negative for dysuria, flank pain, frequency and urgency.  Musculoskeletal: Positive for joint pain (Right arm). Negative for back pain, falls, myalgias and neck pain.  Neurological: Negative for dizziness, tingling, sensory change, focal weakness, loss of consciousness and headaches.  Endo/Heme/Allergies: Does not bruise/bleed easily.  Psychiatric/Behavioral: Negative for  depression, memory loss and substance abuse. The patient is not nervous/anxious.    Blood pressure (!) 202/88, pulse 82, temperature (!) 97.5 F (36.4 C), temperature  source Oral, resp. rate 14, SpO2 100 %. Physical Exam  Constitutional: He appears well-developed and well-nourished. No distress.  HENT:  Head: Normocephalic and atraumatic.  Eyes: Conjunctivae are normal. Right eye exhibits no discharge. Left eye exhibits no discharge. No scleral icterus.  Neck: Normal range of motion.  Cardiovascular: Normal rate and regular rhythm.  Respiratory: Effort normal. No respiratory distress.  Musculoskeletal:  Right shoulder, elbow, wrist, digits- no skin wounds, TTP upper arm, no instability, no blocks to motion  Sens  Ax/R/M/U intact  Mot   Ax/ R/ PIN/ M/ AIN/ U intact  Rad 2+  Neurological: He is alert.  Skin: Skin is warm and dry. He is not diaphoretic.  Psychiatric: He has a normal mood and affect. His behavior is normal.    Assessment/Plan: Right humerus fx -- Plan coap splint and f/u with Dr. Everardo Pacific for conversion to functional splint. NWB.    Freeman Caldron, PA-C Orthopedic Surgery [phone] 05/20/2018, 8:56 AM

## 2018-05-20 NOTE — ED Provider Notes (Signed)
[facility] EMERGENCY DEPARTMENT Provider Note   CSN: [id number] Arrival date & time: 05/20/18  4098     History   Chief Complaint Chief Complaint  Patient presents with  . Fall  . Arm Injury    HPI Shane Weiss is a 84 y.o. male with a history of CVA and HTN who presents to the emergency department by EMS with a chief complaint of fall.  EMS reports the patient tripped and fell while ambulating to the bathroom to have a bowel movement.  The patient is unable to state why he fell, but nursing staff reports he was able to let EMS know that he had a mechanical fall prior to receiving 100 mg of Fentanyl en route.   He denies hitting his head, LOC, nausea, or emesis.  He endorses right upper arm pain.  Denies dizziness, syncope, chest pain, dyspnea, left upper extremity pain, right shoulder, elbow, wrist pain, numbness, weakness, neck pain, chest wall pain, or right hip pain.  No other complaints at this time.   He is right hand dominant.  He does not take any blood thinners.  The history is provided by the patient. No language interpreter was used.    Past Medical History:  Diagnosis Date  . History of Bell's palsy    /notes 01/28/2011  . History of blood transfusion 03/30/2018   "low blood"   . Hypertension   . Stroke [facility]) 2002   denies deficits on 03/30/2018    Patient Active Problem List   Diagnosis Date Noted  . C. difficile diarrhea 03/31/2018  . Ambulatory dysfunction 03/31/2018  . Symptomatic anemia 03/30/2018  . H/O: CVA (cerebrovascular accident) 03/30/2018  . HTN (hypertension) 03/30/2018  . CKD (chronic kidney disease), stage IV (HCC) 03/30/2018    Past Surgical History:  Procedure Laterality Date  . CAROTID ENDARTERECTOMY Right 10/2001   with Dacron patch angioplasty./notes 01/28/2011  . CATARACT EXTRACTION, BILATERAL Bilateral   . ESOPHAGOGASTRODUODENOSCOPY (EGD) WITH PROPOFOL N/A 04/01/2018   Procedure: ESOPHAGOGASTRODUODENOSCOPY (EGD)  WITH PROPOFOL;  Surgeon: Vida Rigger, MD;  Location: [facility] ENDOSCOPY;  Service: Endoscopy;  Laterality: N/A;  . MASS EXCISION     "Bible bump on my scalp; had it removed"        Home Medications    Prior to Admission medications   Medication Sig Start Date End Date Taking? Authorizing Provider  aspirin 81 MG tablet Take 1 tablet (81 mg total) by mouth daily. 04/12/18  Yes Leroy Sea, MD  dorzolamide-timolol (COSOPT) 22.3-6.8 MG/ML ophthalmic solution Place 1 drop into both eyes at bedtime. 08/30/17  Yes [provider]  famotidine (PEPCID) 20 MG tablet Take 1 tablet (20 mg total) by mouth 2 (two) times daily. 04/02/18  Yes Leroy Sea, MD  latanoprost (XALATAN) 0.005 % ophthalmic solution Place 1 drop into both eyes at bedtime. 03/03/18  Yes [provider]  HYDROcodone-acetaminophen (NORCO/VICODIN) 5-325 MG tablet Take 1 tablet by mouth every 6 (six) hours as needed. 05/20/18   Caio Devera A, PA-C    Family History No family history on file.  Social History Social History   Tobacco Use  . Smoking status: Never Smoker  . Smokeless tobacco: Never Used  Substance Use Topics  . Alcohol use: Never    Frequency: Never  . Drug use: Never     Allergies   Patient has no known allergies.   Review of Systems Review of Systems  Constitutional: Negative for appetite change and fever.  Eyes:  Negative for visual disturbance.  Respiratory: Negative for shortness of breath.   Cardiovascular: Negative for chest pain.  Gastrointestinal: Negative for abdominal pain and vomiting.  Genitourinary: Negative for dysuria.  Musculoskeletal: Positive for arthralgias. Negative for back pain.  Skin: Negative for rash.  Allergic/Immunologic: Negative for immunocompromised state.  Neurological: Negative for dizziness, weakness, numbness and headaches.  Psychiatric/Behavioral: Negative for confusion.    Physical Exam Updated Vital Signs BP (!) 147/80   Pulse 83    Temp (!) 97.5 F (36.4 C) (Oral)   Resp 14   SpO2 99%   Physical Exam  Constitutional: He appears well-developed.  HENT:  Head: Normocephalic.  Eyes: Conjunctivae are normal.  Neck: Neck supple.  Cardiovascular: Normal rate and regular rhythm.  No murmur heard. Pulmonary/Chest: Effort normal. No stridor. No respiratory distress. He has no wheezes. He has no rales. He exhibits no tenderness.  Bilateral chest wall and ribs are nontender to palpation.  Abdominal: Soft. He exhibits no distension. There is no tenderness.  Musculoskeletal: He exhibits tenderness and deformity. He exhibits no edema.  Obvious deformity to the right proximal humerus.  Tender to palpation over the proximal and mid humerus.  No tenderness to the right shoulder, elbow, or wrist.  No tenderness to the cervical spinous processes or bilateral paraspinal muscles.  Patient has full range of motion of the neck.  Bilateral hips and lower extremities are nontender to palpation.  Radial pulses are 2+ and symmetric.  Cap refill of the right upper extremity is less than 2 seconds.  Sensation is intact and symmetric to the bilateral upper extremities.  Range of motion deferred at this time as the patient is in a splint placed by EMS.  Neurological: He is alert.  Skin: Skin is warm and dry.  Psychiatric: His behavior is normal.  Nursing note and vitals reviewed.    ED Treatments / Results  Labs (all labs ordered are listed, but only abnormal results are displayed) Labs Reviewed - No data to display  EKG None  Radiology Dg Elbow Complete Right  Result Date: 05/20/2018 CLINICAL DATA:  Pain following fall EXAM: RIGHT ELBOW - COMPLETE 3+ VIEW COMPARISON:  None. FINDINGS: Frontal, oblique, and attempted lateral views obtained. No appreciable elbow joint region fracture or dislocation. No joint effusion. There is a spur arising from the olecranon process of the proximal ulna. No appreciable joint space narrowing or erosion.  IMPRESSION: No evident fracture or dislocation. Spurring along the olecranon process of the proximal ulna. No appreciable joint space narrowing. Electronically Signed   By: Bretta Bang III M.D.   On: 05/20/2018 07:36   Dg Humerus Right  Result Date: 05/20/2018 CLINICAL DATA:  Pain following fall EXAM: RIGHT HUMERUS - 2+ VIEW COMPARISON:  None. FINDINGS: Frontal and lateral views were obtained. There is a comminuted fracture of the humerus located apart semi 14 cm distal to the humeral head. There is mild lateral and anterior displacement of the distal major fracture fragment with respect to the major proximal fragment. No other fractures are evident. No dislocation. There is calcification along the lateral right humeral head, likely due to calcific tendinosis. There is a spur along the olecranon process of the proximal ulna. IMPRESSION: Comminuted fracture proximal right humerus with mild displacement of major fracture fragments. No dislocation. Calcific tendinosis lateral right shoulder region. Electronically Signed   By: Bretta Bang III M.D.   On: 05/20/2018 07:33    Procedures Procedures (including critical care time)  Medications Ordered in ED  Medications  fentaNYL (SUBLIMAZE) injection 50 mcg (50 mcg Intravenous Given 05/20/18 0855)  HYDROcodone-acetaminophen (NORCO/VICODIN) 5-325 MG per tablet 1 tablet (1 tablet Oral Given 05/20/18 1018)     Initial Impression / Assessment and Plan / ED Course  I have reviewed the triage vital signs and the nursing notes.  Pertinent labs & imaging results that were available during my care of the patient were reviewed by me and considered in my medical decision making (see chart for details).     84 year old male with a history of CVA and HTN presenting by EMS from home after he sustained a mechanical fall onto his right side.  On exam, there is an obvious deformity to the right upper arm.  He is neurovascularly intact.  The remainder of his exam  is unremarkable.  The patient was seen and evaluated along with Dr. Eudelia Bunch, attending physician.  Right humerus x-ray with comminuted fracture of the proximal to mid humerus with mild displacement.  On reevaluation, the patient reports that his pain is worsening.  Fentanyl ordered.  Consulted orthopedics and spoke with PA Jeffrery who recommended a coaptation splint.  The patient's wife is established with Dr. Ophelia Charter and would prefer to have the patient follow-up with [facility] orthopedics.  Referral has been given on the patient's discharge paperwork.  During ED stay, blood pressure increased to the 190s to 200 systolic; however, the patient was asymptomatic and manual blood pressure was 147/80.  He was ambulated through the department without difficulty.  Pain improved with Vicodin.  Will discharge with an Rx for same. A 6-month prescription history query was performed using the [place] CSRS prior to discharge.  Strict return precautions given.  He is hemodynamically stable and in no acute distress.  He is safe for discharge to home with outpatient follow-up at this time.  Final Clinical Impressions(s) / ED Diagnoses   Final diagnoses:  Closed displaced comminuted fracture of shaft of right humerus, initial encounter    ED Discharge Orders         Ordered    HYDROcodone-acetaminophen (NORCO/VICODIN) 5-325 MG tablet  Every 6 hours PRN,   Status:  Discontinued     05/20/18 1055    HYDROcodone-acetaminophen (NORCO/VICODIN) 5-325 MG tablet  Every 6 hours PRN     05/20/18 1056           Zi Newbury A, PA-C 05/20/18 1108    Nira Conn, MD 05/21/18 [phone]

## 2018-05-20 NOTE — ED Notes (Signed)
Patient verbalizes understanding of discharge instructions. Opportunity for questioning and answers were provided. 

## 2018-05-20 NOTE — ED Triage Notes (Signed)
Per EMS pt from home where he lives with his wife. Up to have a bowel movement this morning and tripped and fell landing on right arm.  Obvious deformity right humerus. Pt had 100 mcg Fentanyl en route. VSS.

## 2018-05-20 NOTE — Discharge Instructions (Addendum)
Thank you for allowing me to care for you today in the Emergency Department.   You can call Dr. Ophelia Charter office this afternoon or Monday to schedule a follow-up appointment.  They will likely want to see you in the office sometime in the next week.  For pain control at home, take 650 mg of Tylenol every 6 hours for mild to moderate pain.  You can take 1 tablet of Norco every 4-6 hours for severe pain.  Each tablet of Norco also has 325 mg of Tylenol.  Please make sure that if you take Norco and Tylenol that you do not take more than 4000 mg of Tylenol in a 24-hour period.  Norco is also a narcotic so do not drive while you are taking this medication also make sure that you are not drinking alcohol or taking other medications that can make you sleepy with this medicine.  Return to the emergency department if you develop new or worsening symptoms, including if you have another fall or injury if your fingertips turn blue, if you develop dizziness, lightheadedness, or other new, concerning symptoms.

## 2018-05-24 ENCOUNTER — Ambulatory Visit (INDEPENDENT_AMBULATORY_CARE_PROVIDER_SITE_OTHER): Payer: Medicare Other | Admitting: Orthopaedic Surgery

## 2018-05-24 ENCOUNTER — Encounter (INDEPENDENT_AMBULATORY_CARE_PROVIDER_SITE_OTHER): Payer: Self-pay | Admitting: Orthopaedic Surgery

## 2018-05-24 DIAGNOSIS — S42321A Displaced transverse fracture of shaft of humerus, right arm, initial encounter for closed fracture: Secondary | ICD-10-CM | POA: Diagnosis not present

## 2018-05-24 NOTE — Progress Notes (Signed)
Office Visit Note   Patient: Shane Weiss           Date of Birth: 1925-05-03           MRN: [id number] Visit Date: 05/24/2018              Requested by: Shane Blamer, MD [phone] Shane Weiss [address] [place], [place] [address] PCP: Shane Blamer, MD   Assessment & Plan: Visit Diagnoses:  1. Closed displaced transverse fracture of shaft of right humerus, initial encounter     Plan: Impression is stable right humeral shaft fracture.  This should be amenable to nonoperative treatment with Sarmiento brace.  We got this set up today.  I would like to see him back in 2 weeks with repeat 2 view x-rays of the right humerus in the Sarmiento brace.  Follow-Up Instructions: Return in about 2 weeks (around 06/07/2018).   Orders:  No orders of the defined types were placed in this encounter.  No orders of the defined types were placed in this encounter.     Procedures: No procedures performed   Clinical Data: No additional findings.   Subjective: Chief Complaint  Patient presents with  . Right Arm - Pain    Shane Weiss is a 84 year old right-hand-dominant gentleman who had a mechanical fall on 05/20/2018 and sustained a minimally displaced right transverse humeral shaft fracture.  He was evaluated at his PCP for this.  He was sent here for further evaluation and treatment.  He denies any significant pain or swelling.  Denies any numbness and tingling.   Review of Systems  Constitutional: Negative.   All other systems reviewed and are negative.    Objective: Vital Signs: There were no vitals taken for this visit.  Physical Exam  Constitutional: He is oriented to person, place, and time. He appears well-developed and well-nourished.  HENT:  Head: Normocephalic and atraumatic.  Eyes: Pupils are equal, round, and reactive to light.  Neck: Neck supple.  Pulmonary/Chest: Effort normal.  Abdominal: Soft.  Musculoskeletal: Normal range of motion.  Neurological: He is alert and  oriented to person, place, and time.  Skin: Skin is warm.  Psychiatric: He has a normal mood and affect. His behavior is normal. Judgment and thought content normal.  Nursing note and vitals reviewed.   Ortho Exam Right upper extremity shows moderate swelling without any skin compromise.  Neurovascular intact.  No significant pain. Specialty Comments:  No specialty comments available.  Imaging: No results found.   PMFS History: Patient Active Problem List   Diagnosis Date Noted  . C. difficile diarrhea 03/31/2018  . Ambulatory dysfunction 03/31/2018  . Symptomatic anemia 03/30/2018  . H/O: CVA (cerebrovascular accident) 03/30/2018  . HTN (hypertension) 03/30/2018  . CKD (chronic kidney disease), stage IV (HCC) 03/30/2018   Past Medical History:  Diagnosis Date  . History of Bell's palsy    /notes 01/28/2011  . History of blood transfusion 03/30/2018   "low blood"   . Hypertension   . Stroke [facility]) 2002   denies deficits on 03/30/2018    History reviewed. No pertinent family history.  Past Surgical History:  Procedure Laterality Date  . CAROTID ENDARTERECTOMY Right 10/2001   with Dacron patch angioplasty./notes 01/28/2011  . CATARACT EXTRACTION, BILATERAL Bilateral   . ESOPHAGOGASTRODUODENOSCOPY (EGD) WITH PROPOFOL N/A 04/01/2018   Procedure: ESOPHAGOGASTRODUODENOSCOPY (EGD) WITH PROPOFOL;  Surgeon: Vida Rigger, MD;  [facility] END[facility]COPY;  Service: Endoscopy;  Laterality: N/A;  . MASS EXCISION     "Bible  bump on my scalp; had it removed"   Social History   Occupational History  . Not on file  Tobacco Use  . Smoking status: Never Smoker  . Smokeless tobacco: Never Used  Substance and Sexual Activity  . Alcohol use: Never    Frequency: Never  . Drug use: Never  . Sexual activity: Not on file

## 2018-05-30 ENCOUNTER — Telehealth (INDEPENDENT_AMBULATORY_CARE_PROVIDER_SITE_OTHER): Payer: Self-pay | Admitting: Orthopaedic Surgery

## 2018-05-30 NOTE — Telephone Encounter (Signed)
See message below  Please advise

## 2018-05-30 NOTE — Telephone Encounter (Signed)
That's fine

## 2018-05-30 NOTE — Telephone Encounter (Signed)
Shane Weiss, patients daughter is requesting an order for a lift chair for her father. She explains that her mom, [name] is the primary care provider and that she has spinal stenosis and cannot lift him. Please call daughter to advise # [phone]

## 2018-05-31 NOTE — Telephone Encounter (Signed)
Patients daughter called again wondering how she would get order for lift chair? If she should come pick it up or if it would be sent somewhere?  Shes not exactly sure of the process . Please advise when possible # [phone]

## 2018-05-31 NOTE — Telephone Encounter (Signed)
[facility] on [address] to both fax numbers [phone] & [phone]. Phone number is  [phone].  Called Aurther Lofterry to let her know she will call tomorrow to get phone number.

## 2018-06-01 NOTE — Telephone Encounter (Signed)
Aurther Lofterry spoke with [facility] and they do not file insurance claims. Is there another place where this order can go they can file insurance? She knew of someone that got a chair from [facility] if that's a possibility. She just would like some part of the chair covered by insurance. Please advise # [phone]

## 2018-06-02 NOTE — Telephone Encounter (Signed)
Called Aurther Lofterry advised her that we can send it elsewhere. Gave her [facility] number. She will call us back regarding lift chair.

## 2018-06-02 NOTE — Telephone Encounter (Signed)
Shane Weiss called again to check on status of this message. She said she really wants this done as soon as possible.

## 2018-06-03 ENCOUNTER — Telehealth (INDEPENDENT_AMBULATORY_CARE_PROVIDER_SITE_OTHER): Payer: Self-pay

## 2018-06-03 NOTE — Telephone Encounter (Signed)
Faxed order to  1 [phone] [facility] for lift chair.

## 2018-06-07 ENCOUNTER — Encounter (INDEPENDENT_AMBULATORY_CARE_PROVIDER_SITE_OTHER): Payer: Self-pay | Admitting: Orthopaedic Surgery

## 2018-06-07 ENCOUNTER — Ambulatory Visit (INDEPENDENT_AMBULATORY_CARE_PROVIDER_SITE_OTHER): Payer: Medicare Other

## 2018-06-07 ENCOUNTER — Ambulatory Visit (INDEPENDENT_AMBULATORY_CARE_PROVIDER_SITE_OTHER): Payer: Medicare Other | Admitting: Orthopaedic Surgery

## 2018-06-07 DIAGNOSIS — S42321A Displaced transverse fracture of shaft of humerus, right arm, initial encounter for closed fracture: Secondary | ICD-10-CM

## 2018-06-07 NOTE — Progress Notes (Signed)
    Patient: Shane Weiss           Date of Birth: 11/29/1924           MRN: [id number] Visit Date: 06/07/2018 PCP: Shane Weiss, Shane Weiss, Shane Weiss   Assessment & Plan:  Chief Complaint:  Chief Complaint  Patient presents with  . Right Upper Arm - Follow-up   Visit Diagnoses:  1. Closed displaced transverse fracture of shaft of right humerus, initial encounter     Plan: Shane Weiss is 3 weeks status post right midshaft humerus fracture.  He is doing much better and reports no pain.  He currently lives at home.  His physical exam is benign.  I readjusted the Sarmiento brace.  We will see him back in 3 weeks with 2 view x-rays of the right humerus.  Follow-Up Instructions: Return in about 3 weeks (around 06/28/2018).   Orders:  Orders Placed This Encounter  Procedures  . XR Humerus Right   No orders of the defined types were placed in this encounter.   Imaging: Xr Humerus Right  Result Date: 06/07/2018 Acceptable alignment of midshaft humerus fracture.  There is evidence of callus formation consistent with early healing   PMFS History: Patient Active Problem List   Diagnosis Date Noted  . C. difficile diarrhea 03/31/2018  . Ambulatory dysfunction 03/31/2018  . Symptomatic anemia 03/30/2018  . H/O: CVA (cerebrovascular accident) 03/30/2018  . HTN (hypertension) 03/30/2018  . CKD (chronic kidney disease), stage IV (HCC) 03/30/2018   Past Medical History:  Diagnosis Date  . History of Bell's palsy    /notes 01/28/2011  . History of blood transfusion 03/30/2018   "low blood"   . Hypertension   . Stroke [facility](HCC) 2002   denies deficits on 03/30/2018    History reviewed. No pertinent family history.  Past Surgical History:  Procedure Laterality Date  . CAROTID ENDARTERECTOMY Right 10/2001   with Dacron patch angioplasty./notes 01/28/2011  . CATARACT EXTRACTION, BILATERAL Bilateral   . ESOPHAGOGASTRODUODENOSCOPY (EGD) WITH PROPOFOL N/A 04/01/2018   Procedure: ESOPHAGOGASTRODUODENOSCOPY  (EGD) WITH PROPOFOL;  Surgeon: Vida RiggerMagod, Marc, Shane Weiss;  [facility];  Service: Endoscopy;  Laterality: N/A;  . MASS EXCISION     "Bible bump on my scalp; had it removed"   Social History   Occupational History  . Not on file  Tobacco Use  . Smoking status: Never Smoker  . Smokeless tobacco: Never Used  Substance and Sexual Activity  . Alcohol use: Never    Frequency: Never  . Drug use: Never  . Sexual activity: Not on file

## 2018-06-13 ENCOUNTER — Other Ambulatory Visit: Payer: Self-pay | Admitting: Orthopedic Surgery

## 2018-06-20 ENCOUNTER — Telehealth (INDEPENDENT_AMBULATORY_CARE_PROVIDER_SITE_OTHER): Payer: Self-pay

## 2018-06-20 NOTE — Telephone Encounter (Signed)
Tonya with Hospice called wanting to know if brace can be removed from patient's right humerus.  Stated that patient will continue to be there and not at home.  Cb# is [phone].  Please advise.  Thank you.

## 2018-06-20 NOTE — Telephone Encounter (Signed)
Please advise 

## 2018-06-20 NOTE — Telephone Encounter (Signed)
It can be removed for bathing but otherwise needs to be on until he sees me.

## 2018-06-21 NOTE — Telephone Encounter (Signed)
IC advised.  

## 2018-06-28 ENCOUNTER — Ambulatory Visit (INDEPENDENT_AMBULATORY_CARE_PROVIDER_SITE_OTHER): Payer: Medicare Other | Admitting: Orthopaedic Surgery

## 2018-07-15 DEATH — deceased

## 2019-05-04 IMAGING — DX DG ELBOW COMPLETE 3+V*R*
4 series · 4 of 4 positions shown · non-contrast
Comparison: None.

CLINICAL DATA: Pain following fall

EXAM:
RIGHT ELBOW - COMPLETE 3+ VIEW

[elbow ap]
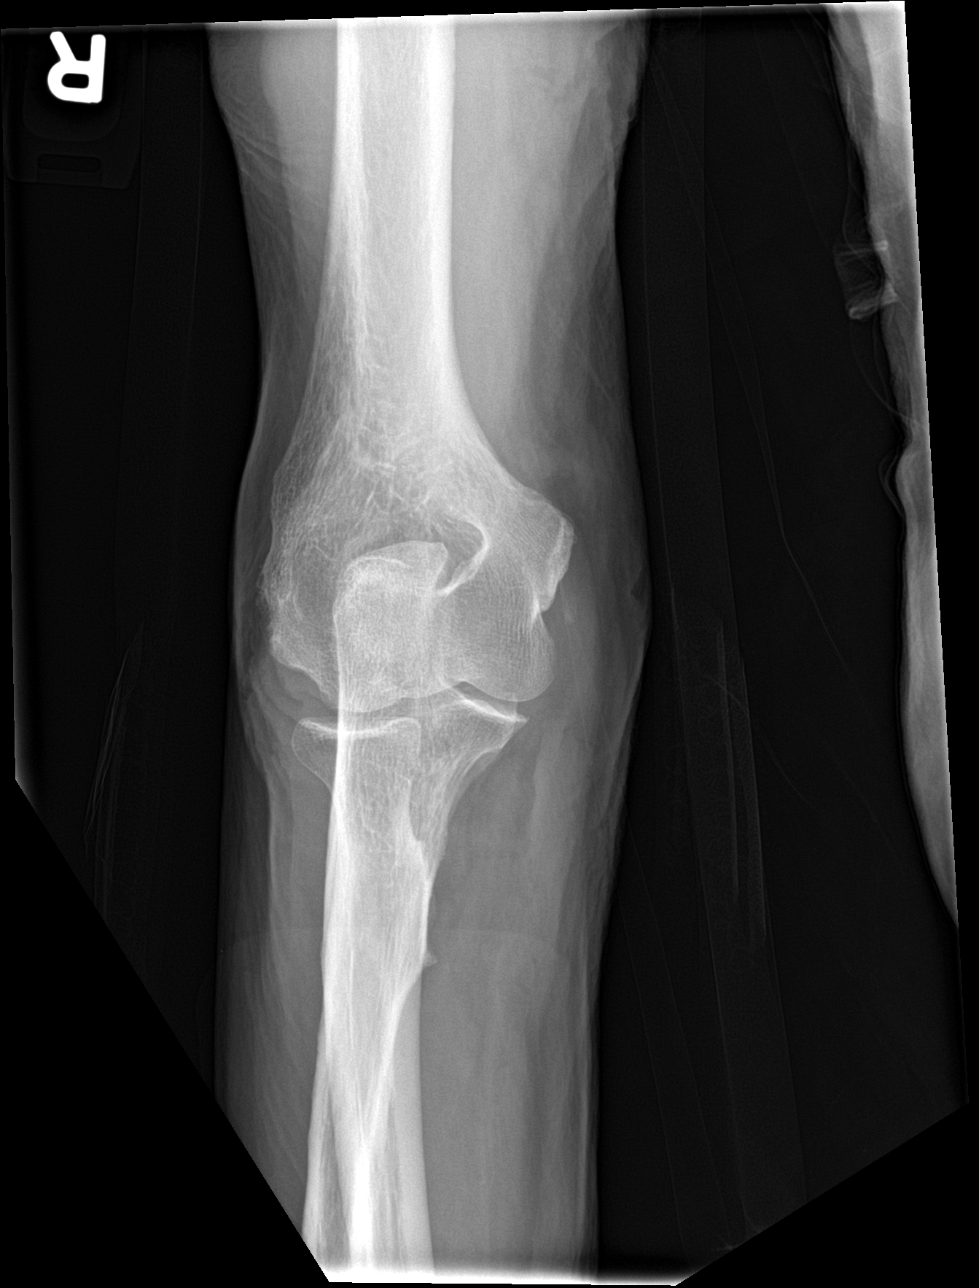

[elbow obl]
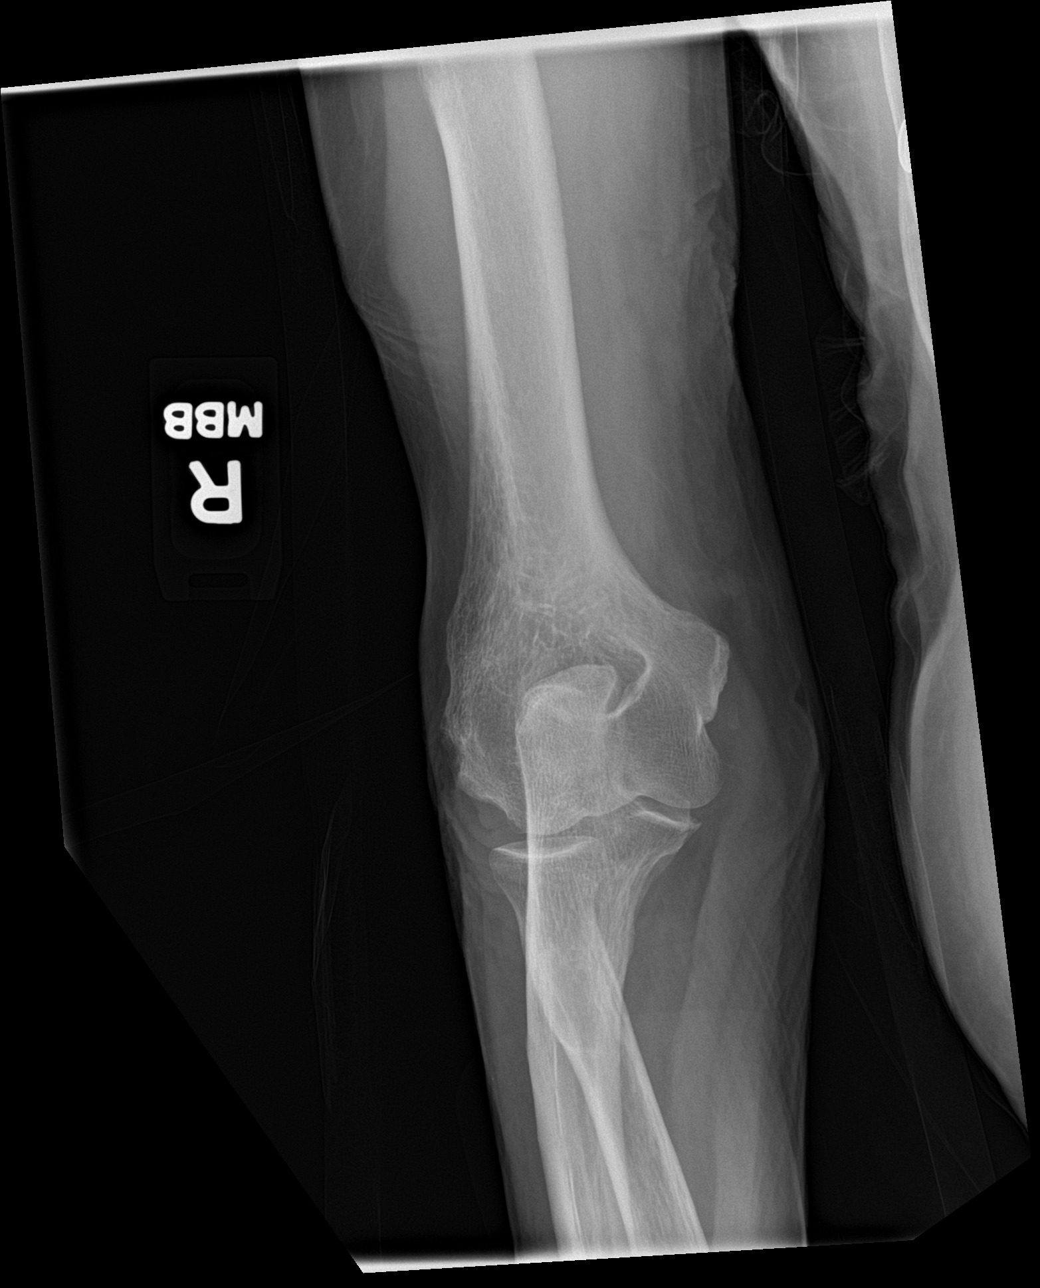

[elbow lat (1 of 2)]
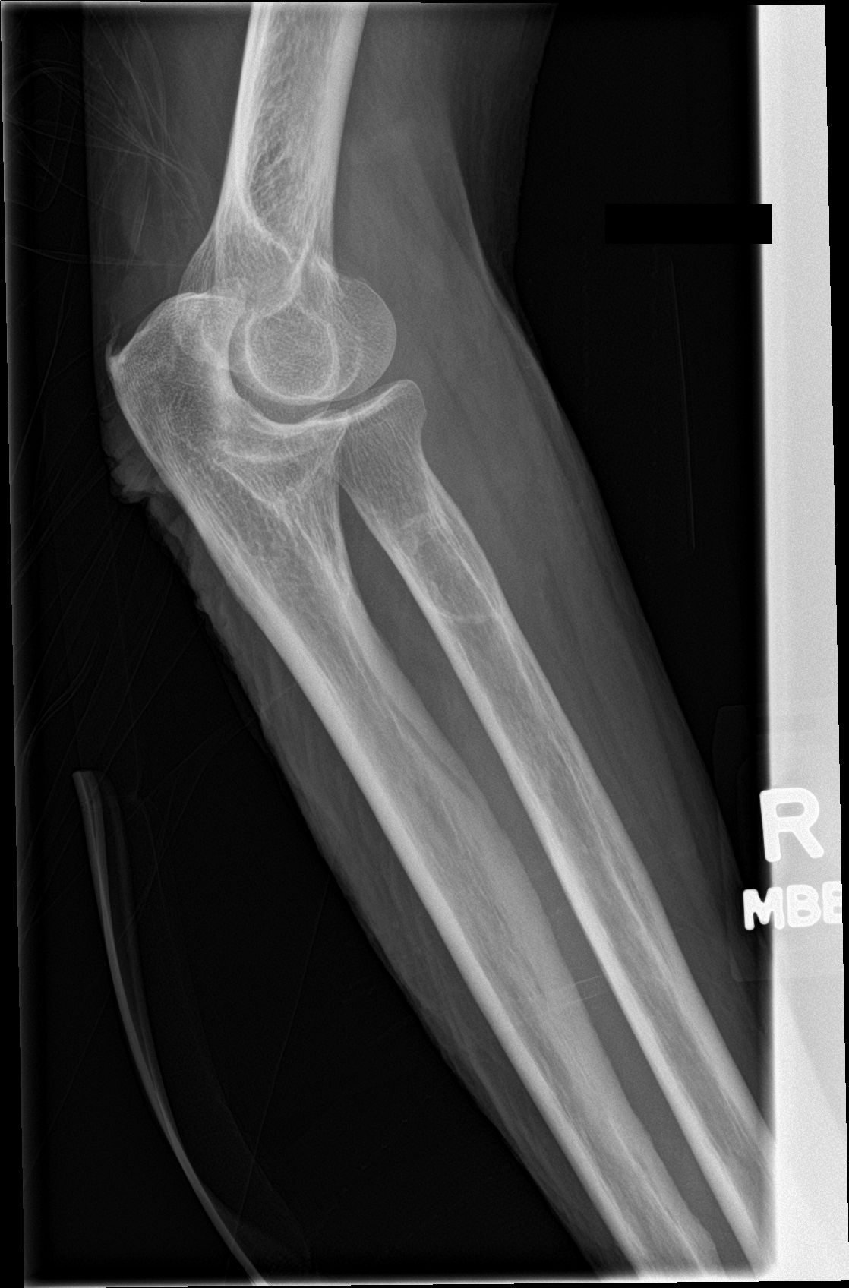

[elbow lat (2 of 2)]
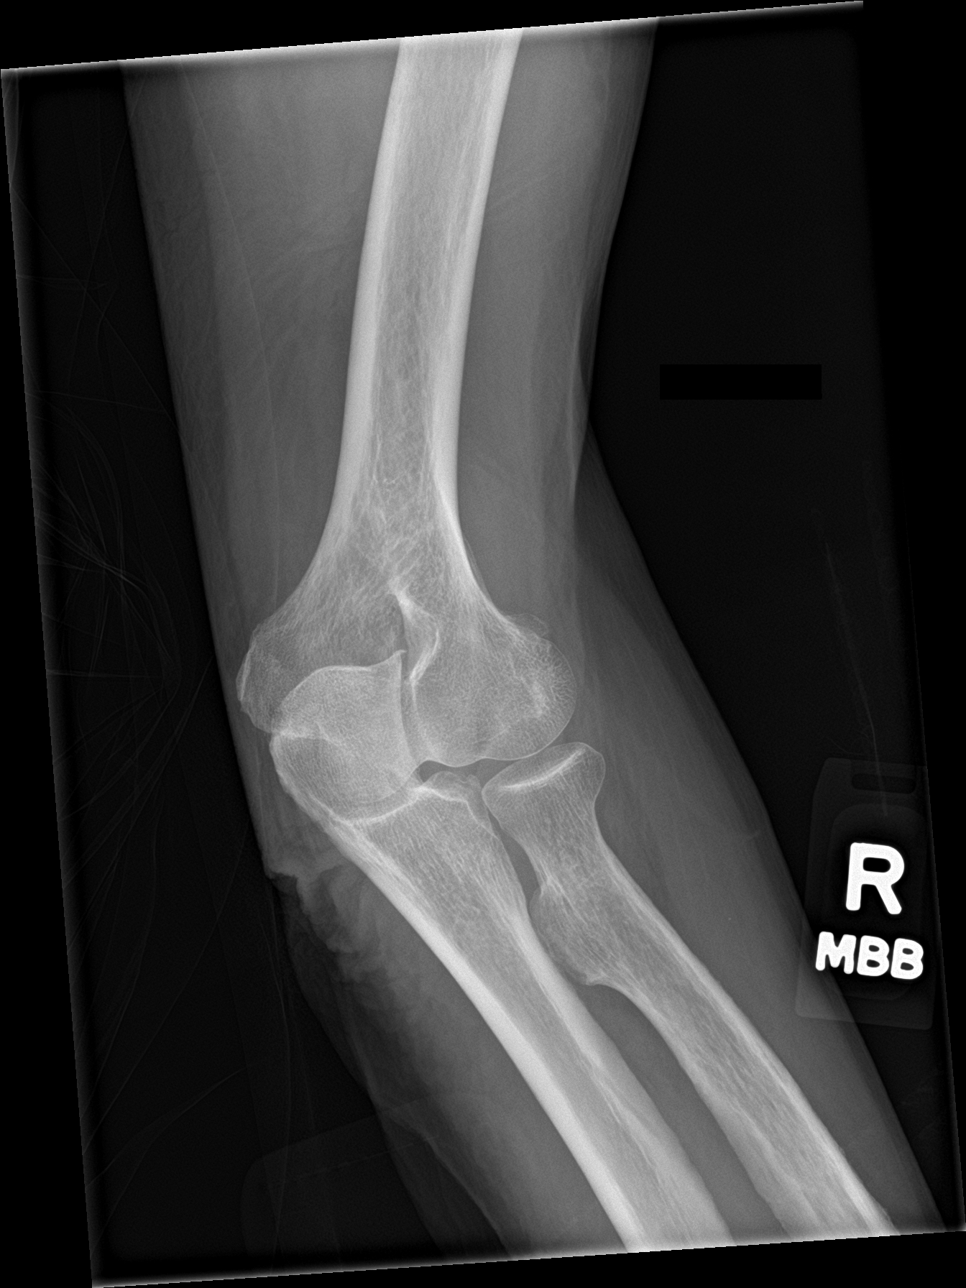

[4 of 4 positions shown; findings below may reference images not displayed]

FINDINGS: Frontal, oblique, and attempted lateral views obtained. No
appreciable elbow joint region fracture or dislocation. No joint
effusion. There is a spur arising from the olecranon process of the
proximal ulna. No appreciable joint space narrowing or erosion.
IMPRESSION: No evident fracture or dislocation. Spurring along the olecranon
process of the proximal ulna. No appreciable joint space narrowing.
